# Patient Record
Sex: Female | Born: 1950
Health system: Southern US, Community
[De-identification: ages and names within clinical notes are randomized; demographics above are authoritative.]

## PROBLEM LIST (undated history)

## (undated) DIAGNOSIS — F32A Depression, unspecified: Secondary | ICD-10-CM

## (undated) DIAGNOSIS — E039 Hypothyroidism, unspecified: Secondary | ICD-10-CM

## (undated) DIAGNOSIS — J302 Other seasonal allergic rhinitis: Secondary | ICD-10-CM

## (undated) DIAGNOSIS — J449 Chronic obstructive pulmonary disease, unspecified: Secondary | ICD-10-CM

## (undated) DIAGNOSIS — I10 Essential (primary) hypertension: Secondary | ICD-10-CM

## (undated) DIAGNOSIS — I4891 Unspecified atrial fibrillation: Secondary | ICD-10-CM

## (undated) DIAGNOSIS — I509 Heart failure, unspecified: Secondary | ICD-10-CM

## (undated) DIAGNOSIS — M1990 Unspecified osteoarthritis, unspecified site: Secondary | ICD-10-CM

## (undated) DIAGNOSIS — J4489 Other specified chronic obstructive pulmonary disease: Secondary | ICD-10-CM

## (undated) DIAGNOSIS — H269 Unspecified cataract: Secondary | ICD-10-CM

## (undated) DIAGNOSIS — K219 Gastro-esophageal reflux disease without esophagitis: Secondary | ICD-10-CM

## (undated) DIAGNOSIS — I498 Other specified cardiac arrhythmias: Secondary | ICD-10-CM

## (undated) DIAGNOSIS — C801 Malignant (primary) neoplasm, unspecified: Secondary | ICD-10-CM

## (undated) HISTORY — DX: Other specified cardiac arrhythmias: I49.8

## (undated) HISTORY — DX: Heart failure, unspecified: I50.9

## (undated) HISTORY — DX: Essential (primary) hypertension: I10

## (undated) HISTORY — DX: Unspecified cataract: H26.9

## (undated) HISTORY — DX: Chronic obstructive pulmonary disease, unspecified: J44.9

## (undated) HISTORY — DX: Unspecified atrial fibrillation: I48.91

## (undated) HISTORY — PX: THYROIDECTOMY: SHX17

## (undated) HISTORY — PX: WISDOM TOOTH EXTRACTION: SHX21

## (undated) HISTORY — DX: Other seasonal allergic rhinitis: J30.2

## (undated) HISTORY — DX: Hypothyroidism, unspecified: E03.9

## (undated) HISTORY — DX: Unspecified osteoarthritis, unspecified site: M19.90

## (undated) HISTORY — DX: Other specified chronic obstructive pulmonary disease: J44.89

## (undated) HISTORY — PX: VAGINAL HYSTERECTOMY: SUR661

## (undated) HISTORY — DX: Gastro-esophageal reflux disease without esophagitis: K21.9

## (undated) HISTORY — DX: Depression, unspecified: F32.A

---

## 1997-07-16 ENCOUNTER — Other Ambulatory Visit: Admission: RE | Admit: 1997-07-16 | Discharge: 1997-07-16 | Payer: Self-pay | Admitting: Obstetrics and Gynecology

## 1998-09-20 ENCOUNTER — Other Ambulatory Visit: Admission: RE | Admit: 1998-09-20 | Discharge: 1998-09-20 | Payer: Self-pay | Admitting: Obstetrics and Gynecology

## 2000-11-26 ENCOUNTER — Other Ambulatory Visit: Admission: RE | Admit: 2000-11-26 | Discharge: 2000-11-26 | Payer: Self-pay | Admitting: Endocrinology

## 2002-09-04 ENCOUNTER — Emergency Department (HOSPITAL_COMMUNITY): Admission: EM | Admit: 2002-09-04 | Discharge: 2002-09-04 | Payer: Self-pay | Admitting: Emergency Medicine

## 2003-03-10 ENCOUNTER — Encounter (INDEPENDENT_AMBULATORY_CARE_PROVIDER_SITE_OTHER): Payer: Self-pay | Admitting: Family Medicine

## 2003-03-10 LAB — CONVERTED CEMR LAB

## 2003-07-15 ENCOUNTER — Emergency Department (HOSPITAL_COMMUNITY): Admission: EM | Admit: 2003-07-15 | Discharge: 2003-07-15 | Payer: Self-pay | Admitting: Emergency Medicine

## 2004-09-07 ENCOUNTER — Emergency Department (HOSPITAL_COMMUNITY): Admission: EM | Admit: 2004-09-07 | Discharge: 2004-09-08 | Payer: Self-pay | Admitting: Emergency Medicine

## 2004-10-05 ENCOUNTER — Emergency Department (HOSPITAL_COMMUNITY): Admission: EM | Admit: 2004-10-05 | Discharge: 2004-10-05 | Payer: Self-pay | Admitting: Emergency Medicine

## 2004-10-14 ENCOUNTER — Emergency Department (HOSPITAL_COMMUNITY): Admission: EM | Admit: 2004-10-14 | Discharge: 2004-10-14 | Payer: Self-pay | Admitting: Emergency Medicine

## 2004-10-14 ENCOUNTER — Emergency Department (HOSPITAL_COMMUNITY): Admission: EM | Admit: 2004-10-14 | Discharge: 2004-10-14 | Payer: Self-pay | Admitting: Family Medicine

## 2004-12-27 ENCOUNTER — Emergency Department (HOSPITAL_COMMUNITY): Admission: EM | Admit: 2004-12-27 | Discharge: 2004-12-27 | Payer: Self-pay | Admitting: Family Medicine

## 2004-12-30 ENCOUNTER — Emergency Department (HOSPITAL_COMMUNITY): Admission: EM | Admit: 2004-12-30 | Discharge: 2004-12-30 | Payer: Self-pay | Admitting: Emergency Medicine

## 2004-12-31 ENCOUNTER — Ambulatory Visit: Payer: Self-pay | Admitting: Family Medicine

## 2005-01-06 ENCOUNTER — Ambulatory Visit (HOSPITAL_COMMUNITY): Admission: RE | Admit: 2005-01-06 | Discharge: 2005-01-06 | Payer: Self-pay | Admitting: Internal Medicine

## 2005-01-15 ENCOUNTER — Ambulatory Visit (HOSPITAL_COMMUNITY): Admission: RE | Admit: 2005-01-15 | Discharge: 2005-01-15 | Payer: Self-pay | Admitting: Internal Medicine

## 2005-01-15 ENCOUNTER — Encounter (INDEPENDENT_AMBULATORY_CARE_PROVIDER_SITE_OTHER): Payer: Self-pay | Admitting: *Deleted

## 2005-01-21 ENCOUNTER — Ambulatory Visit: Payer: Self-pay | Admitting: *Deleted

## 2005-01-30 ENCOUNTER — Ambulatory Visit: Payer: Self-pay | Admitting: Internal Medicine

## 2005-01-30 ENCOUNTER — Inpatient Hospital Stay (HOSPITAL_COMMUNITY): Admission: EM | Admit: 2005-01-30 | Discharge: 2005-02-07 | Payer: Self-pay | Admitting: Family Medicine

## 2005-02-02 ENCOUNTER — Encounter (INDEPENDENT_AMBULATORY_CARE_PROVIDER_SITE_OTHER): Payer: Self-pay | Admitting: *Deleted

## 2005-02-03 ENCOUNTER — Encounter: Payer: Self-pay | Admitting: Cardiology

## 2005-02-05 ENCOUNTER — Ambulatory Visit: Payer: Self-pay | Admitting: Cardiology

## 2005-02-06 ENCOUNTER — Ambulatory Visit: Payer: Self-pay | Admitting: Cardiovascular Disease

## 2005-02-10 ENCOUNTER — Ambulatory Visit: Payer: Self-pay

## 2005-02-13 ENCOUNTER — Ambulatory Visit: Payer: Self-pay | Admitting: Family Medicine

## 2005-02-20 ENCOUNTER — Ambulatory Visit: Payer: Self-pay | Admitting: Family Medicine

## 2005-02-25 ENCOUNTER — Ambulatory Visit: Payer: Self-pay | Admitting: Family Medicine

## 2005-02-26 ENCOUNTER — Ambulatory Visit: Payer: Self-pay | Admitting: Cardiology

## 2005-03-12 ENCOUNTER — Ambulatory Visit: Payer: Self-pay | Admitting: Family Medicine

## 2005-04-02 ENCOUNTER — Ambulatory Visit: Payer: Self-pay | Admitting: Family Medicine

## 2005-04-15 ENCOUNTER — Ambulatory Visit: Payer: Self-pay | Admitting: Cardiology

## 2005-04-21 ENCOUNTER — Ambulatory Visit: Payer: Self-pay

## 2005-04-21 ENCOUNTER — Encounter: Payer: Self-pay | Admitting: Cardiology

## 2005-04-23 ENCOUNTER — Ambulatory Visit: Payer: Self-pay | Admitting: Family Medicine

## 2005-05-25 ENCOUNTER — Ambulatory Visit: Payer: Self-pay | Admitting: Family Medicine

## 2005-05-27 ENCOUNTER — Ambulatory Visit: Payer: Self-pay | Admitting: Family Medicine

## 2005-05-28 ENCOUNTER — Ambulatory Visit (HOSPITAL_COMMUNITY): Admission: RE | Admit: 2005-05-28 | Discharge: 2005-05-28 | Payer: Self-pay | Admitting: Internal Medicine

## 2005-06-01 ENCOUNTER — Ambulatory Visit: Payer: Self-pay | Admitting: Family Medicine

## 2005-06-18 ENCOUNTER — Ambulatory Visit: Payer: Self-pay | Admitting: Family Medicine

## 2005-06-19 ENCOUNTER — Ambulatory Visit: Payer: Self-pay | Admitting: Cardiology

## 2005-06-22 ENCOUNTER — Ambulatory Visit: Payer: Self-pay | Admitting: Endocrinology

## 2005-07-02 ENCOUNTER — Ambulatory Visit: Payer: Self-pay | Admitting: Family Medicine

## 2005-07-13 ENCOUNTER — Ambulatory Visit: Payer: Self-pay | Admitting: Family Medicine

## 2005-07-13 ENCOUNTER — Inpatient Hospital Stay (HOSPITAL_COMMUNITY): Admission: EM | Admit: 2005-07-13 | Discharge: 2005-07-21 | Payer: Self-pay | Admitting: Emergency Medicine

## 2005-07-13 ENCOUNTER — Ambulatory Visit: Payer: Self-pay | Admitting: Cardiology

## 2005-07-14 ENCOUNTER — Encounter: Payer: Self-pay | Admitting: Cardiology

## 2005-08-04 ENCOUNTER — Ambulatory Visit: Payer: Self-pay | Admitting: Cardiology

## 2005-08-07 ENCOUNTER — Ambulatory Visit: Payer: Self-pay | Admitting: Family Medicine

## 2005-08-24 ENCOUNTER — Ambulatory Visit (HOSPITAL_COMMUNITY): Admission: RE | Admit: 2005-08-24 | Discharge: 2005-08-24 | Payer: Self-pay | Admitting: General Surgery

## 2005-08-28 ENCOUNTER — Ambulatory Visit: Payer: Self-pay | Admitting: Family Medicine

## 2005-09-07 ENCOUNTER — Ambulatory Visit: Payer: Self-pay | Admitting: Family Medicine

## 2005-09-23 ENCOUNTER — Ambulatory Visit: Payer: Self-pay | Admitting: Family Medicine

## 2005-09-30 ENCOUNTER — Ambulatory Visit: Payer: Self-pay | Admitting: Cardiology

## 2005-10-06 ENCOUNTER — Encounter (INDEPENDENT_AMBULATORY_CARE_PROVIDER_SITE_OTHER): Payer: Self-pay | Admitting: *Deleted

## 2005-10-06 ENCOUNTER — Ambulatory Visit (HOSPITAL_COMMUNITY): Admission: RE | Admit: 2005-10-06 | Discharge: 2005-10-07 | Payer: Self-pay | Admitting: General Surgery

## 2005-11-06 ENCOUNTER — Ambulatory Visit: Payer: Self-pay | Admitting: Family Medicine

## 2005-12-16 ENCOUNTER — Ambulatory Visit: Payer: Self-pay | Admitting: Family Medicine

## 2005-12-24 ENCOUNTER — Ambulatory Visit: Payer: Self-pay | Admitting: Cardiology

## 2006-01-01 ENCOUNTER — Ambulatory Visit: Payer: Self-pay | Admitting: Cardiology

## 2006-01-04 ENCOUNTER — Ambulatory Visit: Payer: Self-pay | Admitting: Family Medicine

## 2006-01-22 ENCOUNTER — Emergency Department (HOSPITAL_COMMUNITY): Admission: EM | Admit: 2006-01-22 | Discharge: 2006-01-22 | Payer: Self-pay | Admitting: Emergency Medicine

## 2006-02-04 ENCOUNTER — Encounter: Payer: Self-pay | Admitting: Cardiology

## 2006-02-04 ENCOUNTER — Ambulatory Visit: Payer: Self-pay | Admitting: Cardiology

## 2006-02-04 ENCOUNTER — Ambulatory Visit: Payer: Self-pay

## 2006-03-17 ENCOUNTER — Ambulatory Visit: Payer: Self-pay | Admitting: Cardiology

## 2006-03-23 ENCOUNTER — Ambulatory Visit: Payer: Self-pay | Admitting: Family Medicine

## 2006-05-05 ENCOUNTER — Ambulatory Visit: Payer: Self-pay | Admitting: Family Medicine

## 2006-06-09 ENCOUNTER — Ambulatory Visit (HOSPITAL_COMMUNITY): Admission: RE | Admit: 2006-06-09 | Discharge: 2006-06-09 | Payer: Self-pay | Admitting: Family Medicine

## 2006-06-23 ENCOUNTER — Ambulatory Visit: Payer: Self-pay | Admitting: Family Medicine

## 2006-07-22 ENCOUNTER — Ambulatory Visit: Payer: Self-pay | Admitting: Family Medicine

## 2006-08-16 ENCOUNTER — Encounter (INDEPENDENT_AMBULATORY_CARE_PROVIDER_SITE_OTHER): Payer: Self-pay | Admitting: Family Medicine

## 2006-08-16 DIAGNOSIS — I10 Essential (primary) hypertension: Secondary | ICD-10-CM | POA: Insufficient documentation

## 2006-08-16 DIAGNOSIS — K219 Gastro-esophageal reflux disease without esophagitis: Secondary | ICD-10-CM | POA: Insufficient documentation

## 2006-08-16 DIAGNOSIS — I4891 Unspecified atrial fibrillation: Secondary | ICD-10-CM | POA: Insufficient documentation

## 2006-08-16 DIAGNOSIS — Z9089 Acquired absence of other organs: Secondary | ICD-10-CM | POA: Insufficient documentation

## 2006-08-16 DIAGNOSIS — J449 Chronic obstructive pulmonary disease, unspecified: Secondary | ICD-10-CM | POA: Insufficient documentation

## 2006-08-16 DIAGNOSIS — J4489 Other specified chronic obstructive pulmonary disease: Secondary | ICD-10-CM | POA: Insufficient documentation

## 2006-08-16 DIAGNOSIS — M1990 Unspecified osteoarthritis, unspecified site: Secondary | ICD-10-CM | POA: Insufficient documentation

## 2006-08-16 DIAGNOSIS — Z9079 Acquired absence of other genital organ(s): Secondary | ICD-10-CM | POA: Insufficient documentation

## 2006-08-16 DIAGNOSIS — I509 Heart failure, unspecified: Secondary | ICD-10-CM | POA: Insufficient documentation

## 2006-08-16 DIAGNOSIS — E039 Hypothyroidism, unspecified: Secondary | ICD-10-CM | POA: Insufficient documentation

## 2006-09-02 ENCOUNTER — Ambulatory Visit: Payer: Self-pay | Admitting: Internal Medicine

## 2006-09-13 ENCOUNTER — Ambulatory Visit: Payer: Self-pay | Admitting: Family Medicine

## 2006-09-15 ENCOUNTER — Ambulatory Visit: Payer: Self-pay | Admitting: Cardiology

## 2006-09-20 ENCOUNTER — Ambulatory Visit (HOSPITAL_COMMUNITY): Admission: RE | Admit: 2006-09-20 | Discharge: 2006-09-20 | Payer: Self-pay | Admitting: Internal Medicine

## 2006-09-20 ENCOUNTER — Ambulatory Visit: Payer: Self-pay | Admitting: Cardiology

## 2006-09-20 LAB — CONVERTED CEMR LAB
BUN: 23 mg/dL (ref 6–23)
CO2: 27 meq/L (ref 19–32)
Calcium: 9.5 mg/dL (ref 8.4–10.5)
Chloride: 101 meq/L (ref 96–112)
Creatinine, Ser: 1 mg/dL (ref 0.4–1.2)
GFR calc Af Amer: 73 mL/min
GFR calc non Af Amer: 61 mL/min
Glucose, Bld: 72 mg/dL (ref 70–99)
Potassium: 3.8 meq/L (ref 3.5–5.1)
Sodium: 139 meq/L (ref 135–145)

## 2006-10-14 ENCOUNTER — Ambulatory Visit: Payer: Self-pay | Admitting: Internal Medicine

## 2006-12-11 ENCOUNTER — Encounter: Payer: Self-pay | Admitting: *Deleted

## 2006-12-11 DIAGNOSIS — E0590 Thyrotoxicosis, unspecified without thyrotoxic crisis or storm: Secondary | ICD-10-CM | POA: Insufficient documentation

## 2007-04-28 ENCOUNTER — Encounter (INDEPENDENT_AMBULATORY_CARE_PROVIDER_SITE_OTHER): Payer: Self-pay | Admitting: Family Medicine

## 2007-04-28 ENCOUNTER — Ambulatory Visit: Payer: Self-pay | Admitting: Internal Medicine

## 2007-04-28 LAB — CONVERTED CEMR LAB
ALT: 16 units/L (ref 0–35)
BUN: 18 mg/dL (ref 6–23)
CO2: 23 meq/L (ref 19–32)
Calcium: 9.6 mg/dL (ref 8.4–10.5)
Chloride: 107 meq/L (ref 96–112)
Creatinine, Ser: 1.1 mg/dL (ref 0.40–1.20)
TSH: 25.279 microintl units/mL — ABNORMAL HIGH (ref 0.350–5.50)
Total Bilirubin: 0.4 mg/dL (ref 0.3–1.2)

## 2007-05-30 ENCOUNTER — Ambulatory Visit: Payer: Self-pay | Admitting: Internal Medicine

## 2007-06-11 ENCOUNTER — Emergency Department (HOSPITAL_COMMUNITY): Admission: EM | Admit: 2007-06-11 | Discharge: 2007-06-11 | Payer: Self-pay | Admitting: Emergency Medicine

## 2007-06-29 ENCOUNTER — Ambulatory Visit: Payer: Self-pay | Admitting: Internal Medicine

## 2007-07-05 IMAGING — CR DG CHEST 2V
2 series · 2 of 2 positions shown · non-contrast
Comparison: 10/14/04.

CLINICAL DATA: 54-year-old with chest pain and shortness of breath.  Heart murmur.  
 CHEST ? TWO VIEW:

[view not recorded (1 of 2)]
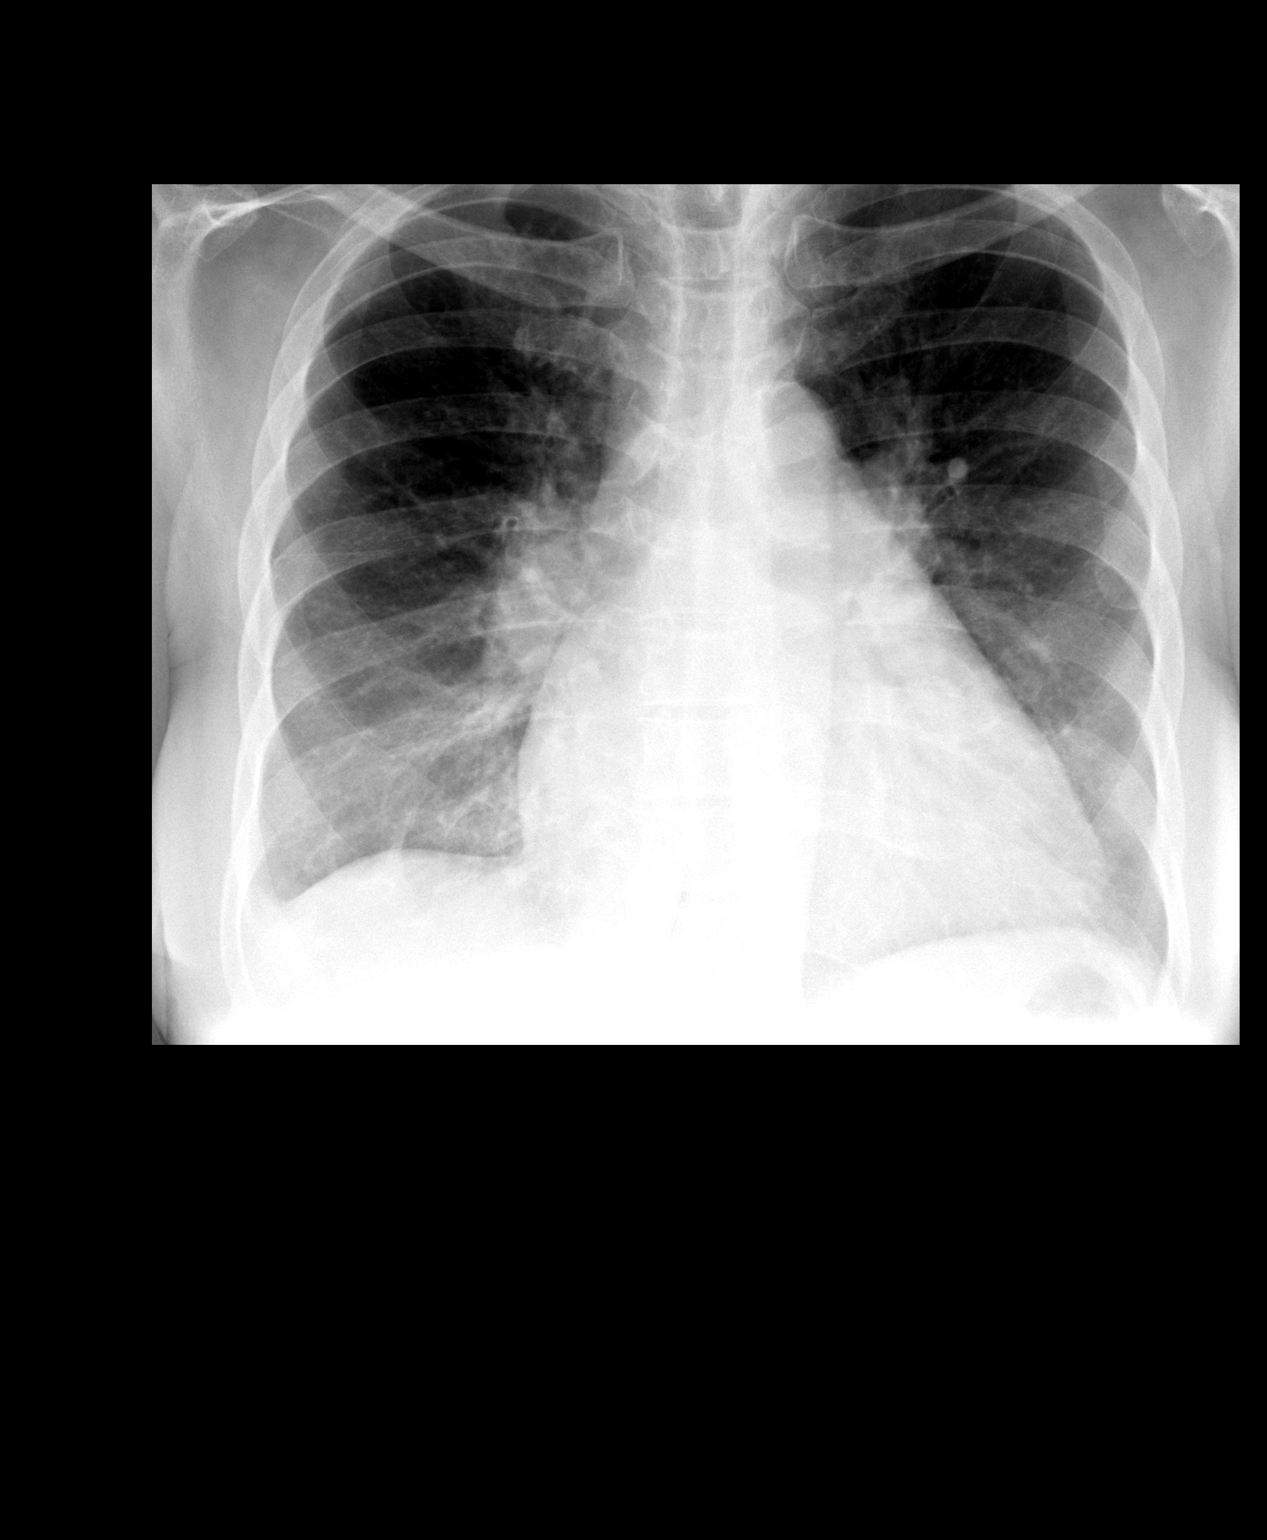

[view not recorded (2 of 2)]
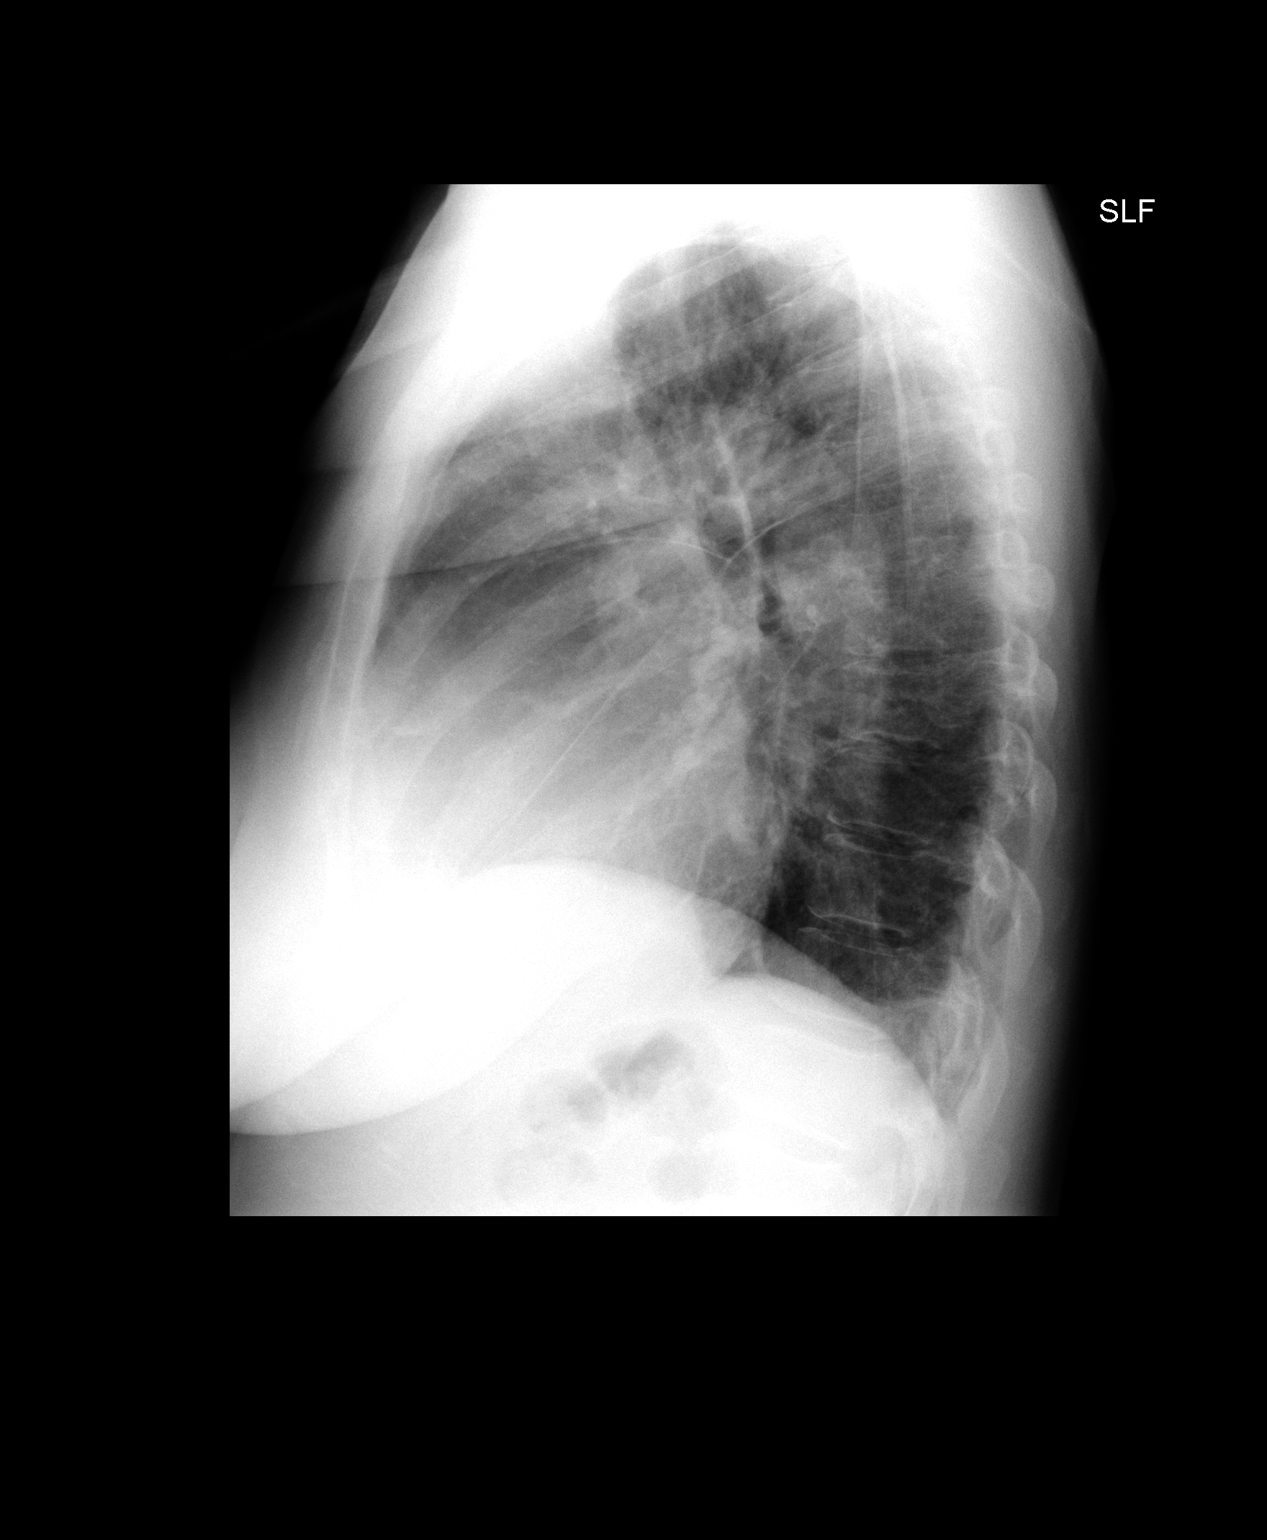

[2 of 2 positions shown; findings below may reference images not displayed]

Heart is enlarged.  There is pulmonary vascular congestion and perihilar pulmonary edema.  No focal consolidation.  There is a small right pleural effusion.
IMPRESSION: Cardiomegaly and mild perihilar edema.

## 2007-07-13 ENCOUNTER — Ambulatory Visit (HOSPITAL_COMMUNITY): Admission: RE | Admit: 2007-07-13 | Discharge: 2007-07-13 | Payer: Self-pay | Admitting: Family Medicine

## 2007-07-15 IMAGING — US US SOFT TISSUE HEAD/NECK
1 series · 14 of 25 positions shown · non-contrast
Comparison: none

CLINICAL DATA: 54-year-old ? clinical Graves disease. 
THYROID ULTRASOUND:
TECHNIQUE: Ultrasound examination of the thyroid gland and adjacent soft tissue structures was performed.

[Series 1: unknown · 0.12mm/px · 14 of 43 slices shown]
[im 1/43]
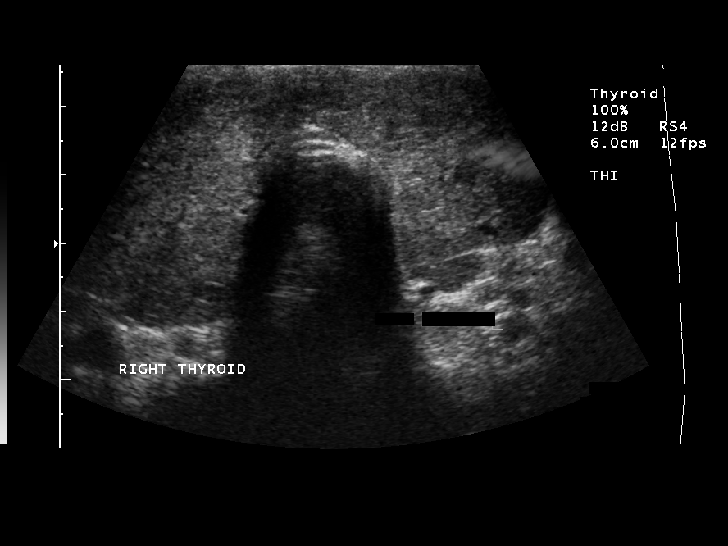
[im 4/43]
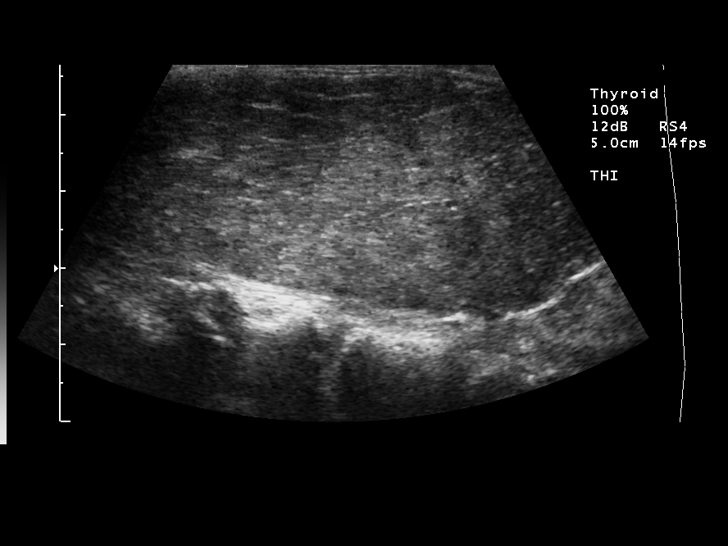
[im 8/43]
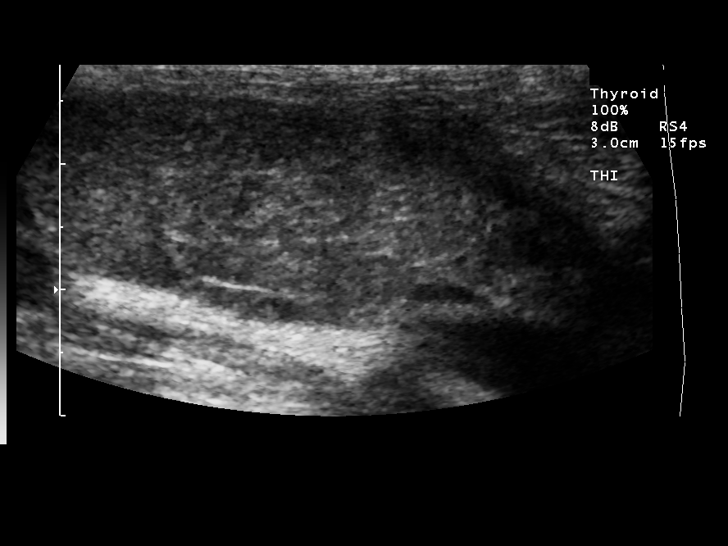
[im 11/43]
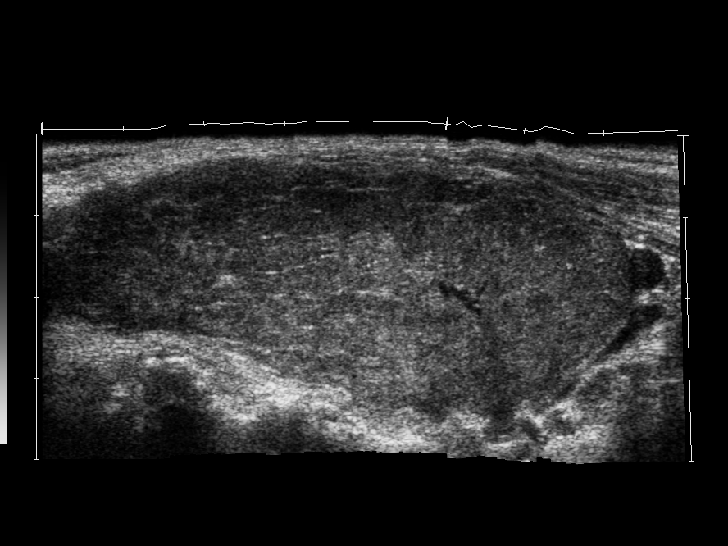
[im 15/43]
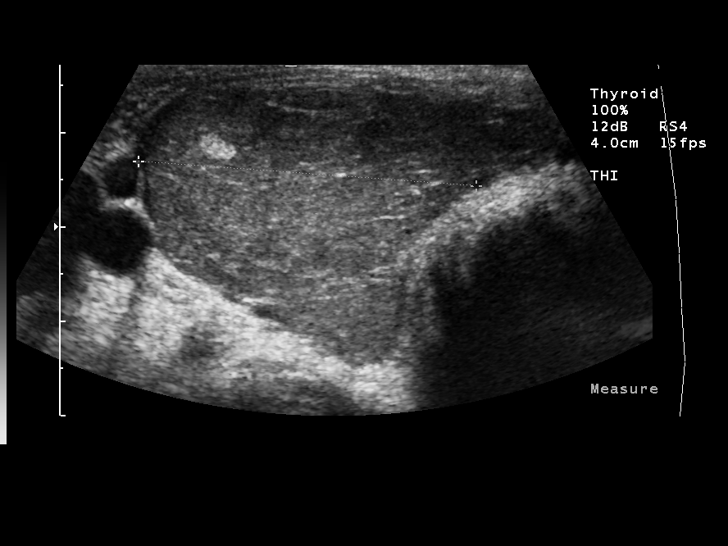
[im 16/43]
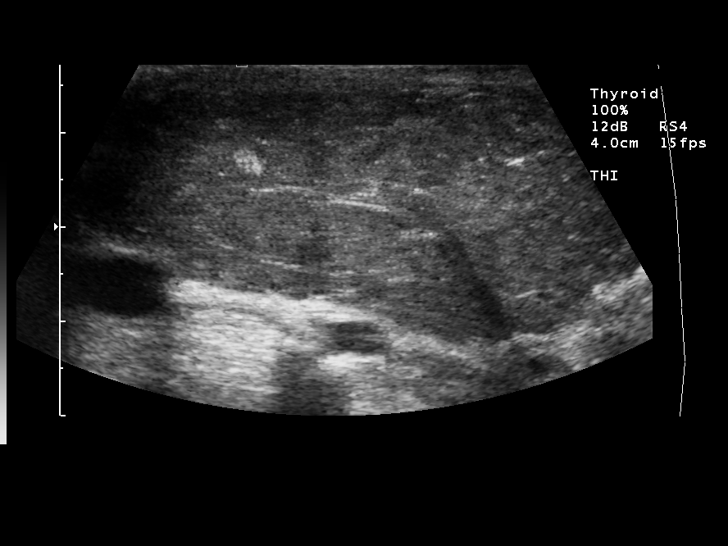
[im 20/43]
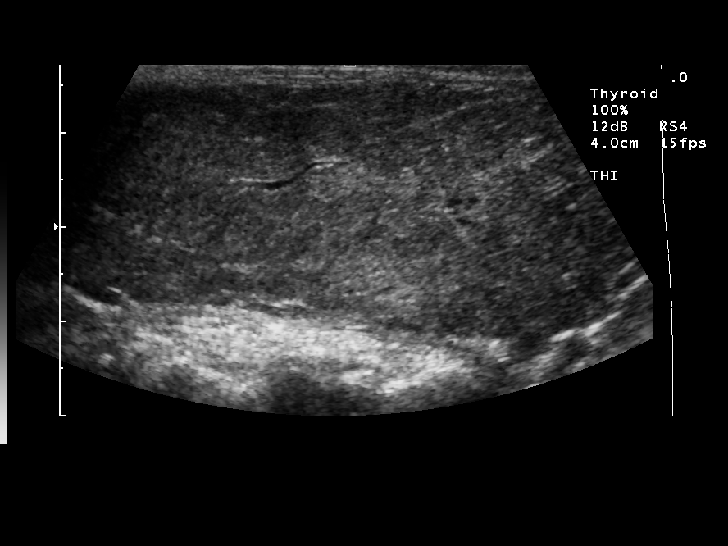
[im 23/43]
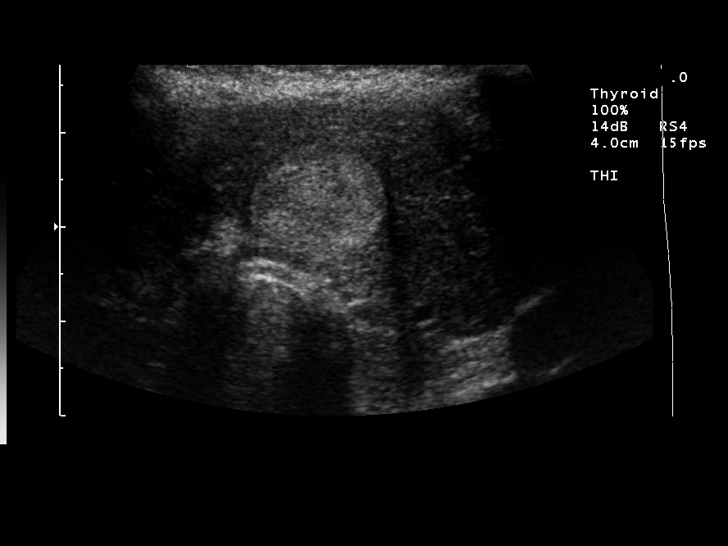
[im 27/43]
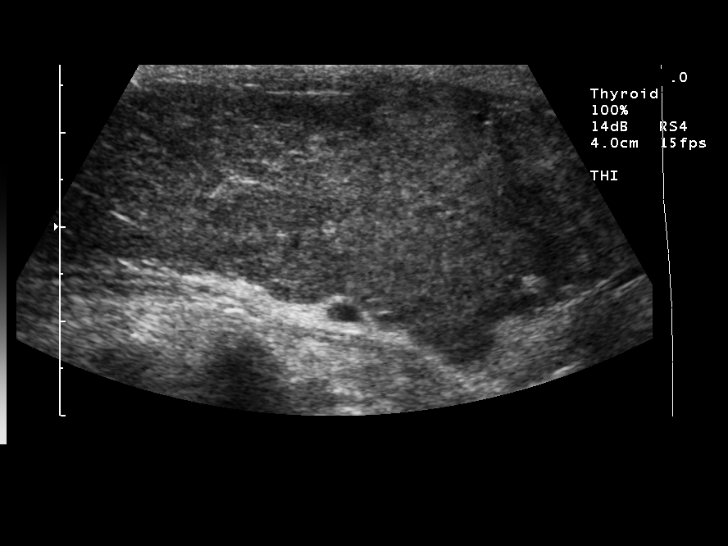
[im 29/43]
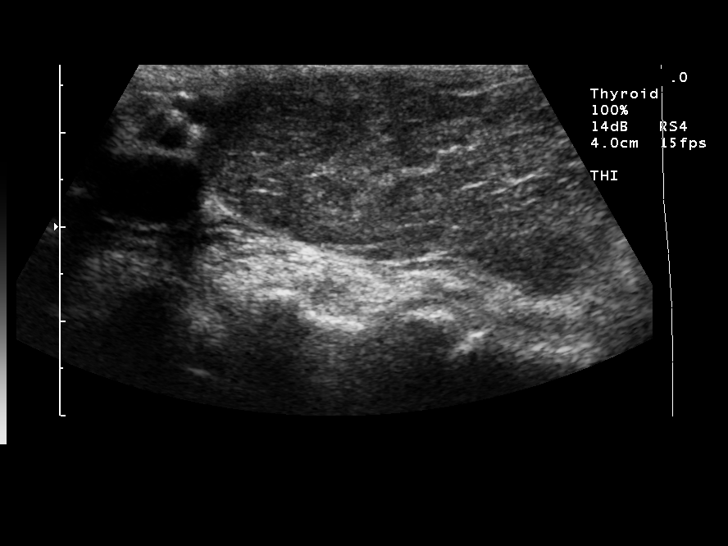
[im 32/43]
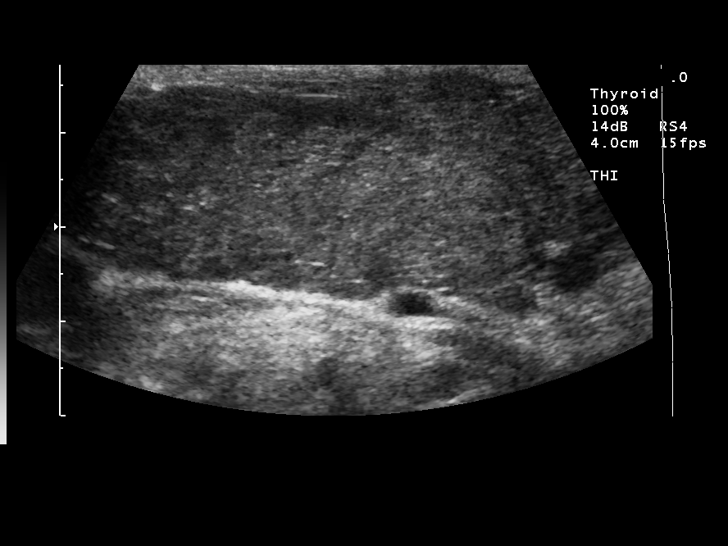
[im 36/43]
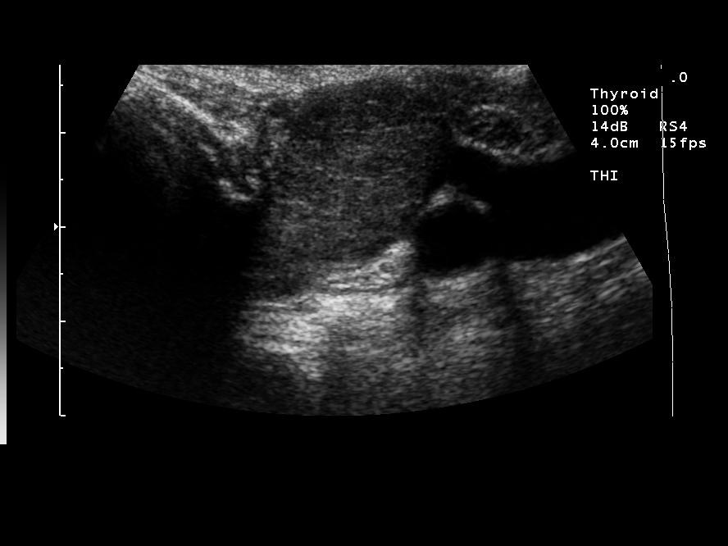
[im 39/43]
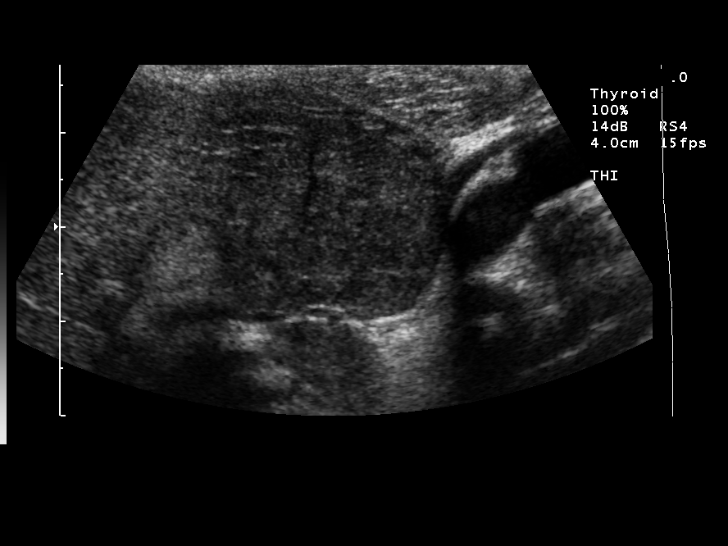
[im 43/43]
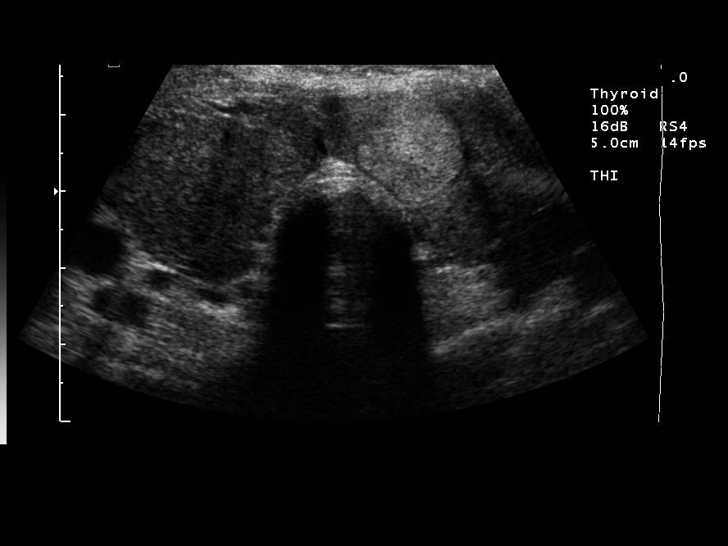

[14 of 25 positions shown; findings below may reference images not displayed]

FINDINGS: The right thyroid lobe measures 7.3 X 3.1 X 3.6 cm.  The left lobe measures 7.1 X 2.8 X 2.8 cm.  The isthmus measures 1.1 cm.  The echogenicity of the thyroid gland is diffusely heterogeneous.  There is a rounded slightly hyperechoic solid nodule in the isthmus.  This measures 1.4 X 1.5 X 1.4 cm.  Given its size and appearance I would recommend a fine needle aspiration biopsy.  There is also a tiny echogenic lesion in the right thyroid lobe which measures approximately 3 X 3 mm in size.  This can be followed with ultrasound on a yearly basis.
IMPRESSION: 1.  Thyroid goiter with diffuse heterogeneity. 
2.  1.4 X 1.5 X 1.4 cm solid nodule in the left aspect of the isthmus.  Recommend fine needle aspiration biopsy.  
3.  3 mm lesion in the right thyroid lobe can be followed with ultrasound on a yearly basis.

## 2007-07-28 ENCOUNTER — Ambulatory Visit: Payer: Self-pay | Admitting: Internal Medicine

## 2007-07-28 ENCOUNTER — Encounter: Payer: Self-pay | Admitting: Family Medicine

## 2007-07-28 LAB — CONVERTED CEMR LAB
ALT: 16 units/L (ref 0–35)
AST: 19 units/L (ref 0–37)
Albumin: 4 g/dL (ref 3.5–5.2)
CO2: 23 meq/L (ref 19–32)
Calcium: 9.6 mg/dL (ref 8.4–10.5)
Chloride: 108 meq/L (ref 96–112)
Cholesterol: 176 mg/dL (ref 0–200)
Creatinine, Ser: 0.92 mg/dL (ref 0.40–1.20)
Potassium: 4.3 meq/L (ref 3.5–5.3)
Sodium: 142 meq/L (ref 135–145)
T3, Total: 133.6 ng/dL (ref 80.0–204.0)
T4, Total: 14.3 ug/dL — ABNORMAL HIGH (ref 5.0–12.5)
TSH: 0.008 microintl units/mL — ABNORMAL LOW (ref 0.350–5.50)
Total CHOL/HDL Ratio: 2.8
Total Protein: 7.9 g/dL (ref 6.0–8.3)

## 2007-08-03 ENCOUNTER — Ambulatory Visit: Payer: Self-pay | Admitting: Internal Medicine

## 2007-09-15 ENCOUNTER — Ambulatory Visit: Payer: Self-pay | Admitting: Internal Medicine

## 2007-09-15 ENCOUNTER — Encounter: Payer: Self-pay | Admitting: Family Medicine

## 2007-09-15 LAB — CONVERTED CEMR LAB
Free T4: 1.86 ng/dL — ABNORMAL HIGH (ref 0.89–1.80)
T3, Total: 112.5 ng/dL (ref 80.0–204.0)
TSH: 0.074 microintl units/mL — ABNORMAL LOW (ref 0.350–4.50)

## 2007-09-21 ENCOUNTER — Ambulatory Visit: Payer: Self-pay | Admitting: Internal Medicine

## 2007-09-28 ENCOUNTER — Ambulatory Visit: Payer: Self-pay | Admitting: Cardiology

## 2007-09-28 LAB — CONVERTED CEMR LAB
CO2: 27 meq/L (ref 19–32)
GFR calc Af Amer: 83 mL/min
Glucose, Bld: 84 mg/dL (ref 70–99)
Potassium: 4.1 meq/L (ref 3.5–5.1)
Sodium: 141 meq/L (ref 135–145)

## 2007-09-29 ENCOUNTER — Emergency Department (HOSPITAL_COMMUNITY): Admission: EM | Admit: 2007-09-29 | Discharge: 2007-09-29 | Payer: Self-pay | Admitting: Emergency Medicine

## 2008-07-18 ENCOUNTER — Ambulatory Visit (HOSPITAL_COMMUNITY): Admission: RE | Admit: 2008-07-18 | Discharge: 2008-07-18 | Payer: Self-pay | Admitting: Internal Medicine

## 2008-07-23 ENCOUNTER — Encounter (INDEPENDENT_AMBULATORY_CARE_PROVIDER_SITE_OTHER): Payer: Self-pay | Admitting: *Deleted

## 2008-10-04 ENCOUNTER — Ambulatory Visit: Payer: Self-pay | Admitting: Cardiology

## 2008-10-04 ENCOUNTER — Encounter (INDEPENDENT_AMBULATORY_CARE_PROVIDER_SITE_OTHER): Payer: Self-pay | Admitting: *Deleted

## 2008-10-04 DIAGNOSIS — I498 Other specified cardiac arrhythmias: Secondary | ICD-10-CM | POA: Insufficient documentation

## 2008-11-18 ENCOUNTER — Emergency Department (HOSPITAL_COMMUNITY): Admission: EM | Admit: 2008-11-18 | Discharge: 2008-11-18 | Payer: Self-pay | Admitting: Emergency Medicine

## 2009-08-12 ENCOUNTER — Ambulatory Visit (HOSPITAL_COMMUNITY): Admission: RE | Admit: 2009-08-12 | Discharge: 2009-08-12 | Payer: Self-pay | Admitting: Internal Medicine

## 2009-12-10 ENCOUNTER — Ambulatory Visit: Payer: Self-pay | Admitting: Cardiology

## 2009-12-10 DIAGNOSIS — R079 Chest pain, unspecified: Secondary | ICD-10-CM | POA: Insufficient documentation

## 2009-12-11 ENCOUNTER — Encounter (INDEPENDENT_AMBULATORY_CARE_PROVIDER_SITE_OTHER): Payer: Self-pay | Admitting: *Deleted

## 2009-12-11 LAB — CONVERTED CEMR LAB
BUN: 15 mg/dL (ref 6–23)
Chloride: 105 meq/L (ref 96–112)
Glucose, Bld: 78 mg/dL (ref 70–99)
Potassium: 4.2 meq/L (ref 3.5–5.1)

## 2009-12-18 ENCOUNTER — Telehealth (INDEPENDENT_AMBULATORY_CARE_PROVIDER_SITE_OTHER): Payer: Self-pay | Admitting: Radiology

## 2009-12-19 ENCOUNTER — Encounter (HOSPITAL_COMMUNITY)
Admission: RE | Admit: 2009-12-19 | Discharge: 2010-01-03 | Payer: Self-pay | Source: Home / Self Care | Attending: Cardiology | Admitting: Cardiology

## 2009-12-19 ENCOUNTER — Ambulatory Visit: Payer: Self-pay

## 2009-12-19 ENCOUNTER — Ambulatory Visit: Payer: Self-pay | Admitting: Cardiology

## 2009-12-19 ENCOUNTER — Encounter: Payer: Self-pay | Admitting: Cardiology

## 2010-01-10 ENCOUNTER — Encounter: Payer: Self-pay | Admitting: Cardiology

## 2010-01-13 ENCOUNTER — Ambulatory Visit: Payer: Self-pay | Admitting: Cardiology

## 2010-04-10 NOTE — Assessment & Plan Note (Signed)
Summary: PER CHECK OUT/SF   Primary [name] [name]:  dr polite  CC:  check up...pt complains of sob.  History of Present Illness: [name] [name] is a very pleasant  female who has a history of congestive heart failure, felt secondary to diastolic dysfunction and paroxysmal atrial fibrillation in the setting of hyperthyroidism and mild bradycardia.   Note, she had her last echocardiogram performed on February 04, 2006, that showed normal LV function and trivial mitral regurgitation and tricuspid regurgitation.  I saw her in October of 2011 with atypical chest pain. She had a Myoview performed in Oct 2011 that showed soft tissue attenuation but no ischemia and an ejection fraction of 77%. Since then the patient has dyspnea with more extreme activities but not with routine activities. It is relieved with rest. It is not associated with chest pain. There is no orthopnea, PND or pedal edema. There is no syncope or palpitations. There is no exertional chest pain. She has been on a diet and lost 10 pounds. She feels better because of this. She has had no recurrent chest pain.   Current Medications (verified): 1)  Omeprazole 20 Mg Cpdr (Omeprazole) .... Two Times A Day 2)  Singulair 10 Mg Tabs (Montelukast Sodium) .Marland Kitchen.. 1 By Mouth Once Daily 3)  Furosemide 20 Mg Tabs (Furosemide) .Marland Kitchen.. 1 By Mouth Every Am 4)  Norvasc 5 Mg Tabs (Amlodipine Besylate) .Marland Kitchen.. 1 By Mouth Once Daily 5)  Levothroid 88 Mcg Tabs (Levothyroxine Sodium) .Marland Kitchen.. 1  Tab By Mouth Once Daily 6)  Colace 100 Mg Caps (Docusate Sodium) .... By Mouth At Bedtime 7)  Caltrate 600+d  Tabs (Calcium Carbonate-Vitamin D Tabs) .Marland Kitchen.. 1 By Mouth Two Times A Day 8)  Diovan 160 Mg Tabs (Valsartan) .Marland Kitchen.. 1 Tab Once Daily 9)  Enablex 7.5 Mg Xr24h-Tab (Darifenacin Hydrobromide) .Marland Kitchen.. 1 Ta Once Daily 10)  Alphagan P 0.15 % Soln (Brimonidine Tartrate) .Marland Kitchen.. 1 Drop Both Eyes Three Times A Day 11)  Travatan Z 0.004 % Soln (Travoprost) .... Take As Directed 12)  Aspirin 81 Mg  Tbec (Aspirin) .... Take One Tablet By Mouth Daily  Allergies: 1)  ! Penicillin  Past History:  Past Medical History: Reviewed history from 10/04/2008 and no changes required. HYPERTHYROIDISM (ICD-242.90) CONGESTIVE HEART FAILURE (ICD-428.0) Hx of THYROIDECTOMY, HX OF (ICD-V45.79) HYSTERECTOMY, HX OF (ICD-V45.77) OSTEOARTHRITIS (ICD-715.90) GERD (ICD-530.81) HYPERTENSION (ICD-401.9) ATRIAL FIBRILLATION, PAROXYSMAL (ICD-427.31) COPD (ICD-496) HYPOTHYROIDISM (ICD-244.9)  Past Surgical History: Thyroidectomy  Social History: Reviewed history from 10/04/2008 and no changes required.   The patient does not drink.  She does not use illicit  drugs.  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  She lives in [place]. Tobacco Use - No.   Review of Systems       no fevers or chills, productive cough, hemoptysis, dysphasia, odynophagia, melena, hematochezia, dysuria, hematuria, rash, seizure activity, orthopnea, PND, pedal edema, claudication. Remaining systems are negative.   Vital Signs:  Patient profile:   60 year old female Height:      63 inches Weight:      170 pounds BMI:     30.22 Pulse rate:   49 / minute Resp:     14 per minute BP sitting:   106 / 73  (left arm)  Vitals Entered By: Kem Parkinson (January 13, 2010 11:13 AM)  Physical Exam  General:  Well-developed well-nourished in no acute distress.  Skin is warm and dry.  HEENT is normal.  Neck is supple. No thyromegaly.  Chest is clear to auscultation with normal expansion.  Cardiovascular exam is regular rate and rhythm.  Abdominal exam nontender or distended. No masses palpated. Extremities show no edema. neuro grossly intact    Impression & Recommendations:  Problem # 1:  CHEST PAIN (ICD-786.50) No further symptoms. Myoview showed soft tissue attenuation but no ischemia. No further workup at this point. Her updated medication list for this problem includes:    Norvasc 5 Mg Tabs  (Amlodipine besylate) .Marland Kitchen... 1 by mouth once daily    Aspirin 81 Mg Tbec (Aspirin) .Marland Kitchen... Take one tablet by mouth daily  Problem # 2:  BRADYCARDIA (ICD-427.89) No recent symptoms. Her updated medication list for this problem includes:    Norvasc 5 Mg Tabs (Amlodipine besylate) .Marland Kitchen... 1 by mouth once daily    Aspirin 81 Mg Tbec (Aspirin) .Marland Kitchen... Take one tablet by mouth daily  Problem # 3:  HYPERTENSION (ICD-401.9) Blood pressure control. Potassium and renal function monitored by primary care. Her updated medication list for this problem includes:    Furosemide 20 Mg Tabs (Furosemide) .Marland Kitchen... 1 by mouth every am    Norvasc 5 Mg Tabs (Amlodipine besylate) .Marland Kitchen... 1 by mouth once daily    Diovan 160 Mg Tabs (Valsartan) .Marland Kitchen... 1 tab once daily    Aspirin 81 Mg Tbec (Aspirin) .Marland Kitchen... Take one tablet by mouth daily  Problem # 4:  ATRIAL FIBRILLATION, PAROXYSMAL (ICD-427.31) Occurred in the setting of hyperthyroidism. Now corrected. Her updated medication list for this problem includes:    Aspirin 81 Mg Tbec (Aspirin) .Marland Kitchen... Take one tablet by mouth daily  Problem # 5:  HYPOTHYROIDISM (ICD-244.9)  Her updated medication list for this problem includes:    Levothroid 88 Mcg Tabs (Levothyroxine sodium) .Marland Kitchen... 1  tab by mouth once daily  Problem # 6:  COPD (ICD-496)  Her updated medication list for this problem includes:    Singulair 10 Mg Tabs (Montelukast sodium) .Marland Kitchen... 1 by mouth once daily  Patient Instructions: 1)  Your physician recommends that you schedule a follow-up appointment in: ONE YEAR

## 2010-04-10 NOTE — Miscellaneous (Signed)
Summary: Orders Update  Clinical Lists Changes  Orders: Added new Referral order of Nuclear Stress Test (Nuc Stress Test) - Signed 

## 2010-04-10 NOTE — Assessment & Plan Note (Signed)
Summary: Cardiology Nuclear Testing  Nuclear Med Background Indications for Stress Test: Evaluation for Ischemia   History: COPD, Echo, Myocardial Perfusion Study  History Comments: '06 [id number]:[id number]; h/o afib.  Symptoms: Chest Pain, Chest Tightness, Chest Tightness with Exertion, DOE, Fatigue  Symptoms Comments: CP>(R)arm numbness. Last episode of CP:2 days ago.   Nuclear Pre-Procedure Cardiac Risk Factors: History of Smoking, Hypertension Caffeine/Decaff Intake: None NPO After: 11:00 PM Lungs: Clear.  O2 Sat 99% on RA. IV 0.9% NS with Angio Cath: 22g     IV Site: R Wrist IV Started by: Bonnita Levan, RN Chest Size (in) 38     Cup Size D     Height (in): 63 Weight (lb): 167 BMI: 29.69  Nuclear Med Study 1 or 2 day study:  1 day     Stress Test Type:  Eugenie Birks Reading MD:  Olga Millers, MD     Referring MD:  Olga Millers, MD Resting Radionuclide:  Technetium 42m Tetrofosmin     Resting Radionuclide Dose:  11 mCi  Stress Radionuclide:  Technetium 20m Tetrofosmin     Stress Radionuclide Dose:  33 mCi   Stress Protocol   Lexiscan: 0.4 mg   Stress Test Technologist:  Rea College, CMA-N     Nuclear Technologist:  Doyne Keel, CNMT  Rest Procedure  Myocardial perfusion imaging was performed at rest 45 minutes following the intravenous administration of Technetium 25m Tetrofosmin.  Stress Procedure  The patient received IV Lexiscan 0.4 mg over 15-seconds.  Technetium 78m Tetrofosmin injected at 30-seconds.  There were nonspecific T-wave changes and occasional PVC's with infusion.  Quantitative spect images were obtained after a 45 minute delay.  QPS Raw Data Images:  Acquisition technically good; normal left ventricular size. Stress Images:  There is decreased uptake in the anterior wall. Rest Images:  There is decreased uptake in the anterior wall (slight reversibility felt to most likely not be clinically significant). Subtraction (SDS):  No evidence of  ischemia. Transient Ischemic Dilatation:  .99  (Normal <1.22)  Lung/Heart Ratio:  .27  (Normal <0.45)  Quantitative Gated Spect Images QGS EDV:  79 ml QGS ESV:  18 ml QGS EF:  77 % QGS cine images:  Normal wall motion.   Overall Impression  Exercise Capacity: Lexiscan with no exercise. BP Response: Normal blood pressure response. Clinical Symptoms: No chest pain ECG Impression: No significant ST segment change suggestive of ischemia. Overall Impression: Probable normal lexiscan nuclear study with soft tissue attenuation but no ischemia.  Appended Document: Cardiology Nuclear Testing ok  Appended Document: Cardiology Nuclear Testing PT AWARE./CY

## 2010-04-10 NOTE — Miscellaneous (Signed)
  Clinical Lists Changes  Orders: Added new Test order of TLB-BMP (Basic Metabolic Panel-BMET) (80048-METABOL) - Signed

## 2010-04-10 NOTE — Assessment & Plan Note (Signed)
Summary: f1y/per pt call/chest pain off and on/pain in arm/lg   Primary Preethi Scantlebury:  dr polite  CC:  chest pain  and and arm numbness.  History of Present Illness: [name] [name] is a very pleasant  female who has a history of congestive heart failure, felt secondary to diastolic dysfunction and paroxysmal atrial fibrillation in the setting of hyperthyroidism and mild bradycardia.   Note, she had her last echocardiogram performed on February 04, 2006, that showed normal LV function and trivial mitral regurgitation and tricuspid regurgitation.  She has also had a previous Myoview performed on February 06, 2005, that showed no ischemia and an ejection fraction of 63%. I last saw her in July of 2010. Since then over the past several weeks she has noticed an occasional tightness in her chest. It occurs with exertion and improves with rest. It lasts 3-5 seconds and resolve spontaneously. It is not pleuritic or positional. It is not related to food. There is no associated symptoms.  Current Medications (verified): 1)  Omeprazole 20 Mg Cpdr (Omeprazole) .... Two Times A Day 2)  Singulair 10 Mg Tabs (Montelukast Sodium) .Marland Kitchen.. 1 By Mouth Once Daily 3)  Furosemide 20 Mg Tabs (Furosemide) .Marland Kitchen.. 1 By Mouth Every Am 4)  Norvasc 5 Mg Tabs (Amlodipine Besylate) .Marland Kitchen.. 1 By Mouth Once Daily 5)  Levothroid 88 Mcg Tabs (Levothyroxine Sodium) .Marland Kitchen.. 1  Tab By Mouth Once Daily 6)  Colace 100 Mg Caps (Docusate Sodium) .... By Mouth At Bedtime 7)  Caltrate 600+d  Tabs (Calcium Carbonate-Vitamin D Tabs) .Marland Kitchen.. 1 By Mouth Two Times A Day 8)  Diovan 160 Mg Tabs (Valsartan) .Marland Kitchen.. 1 Tab Once Daily 9)  Enablex 7.5 Mg Xr24h-Tab (Darifenacin Hydrobromide) .Marland Kitchen.. 1 Ta Once Daily 10)  Alphagan P 0.15 % Soln (Brimonidine Tartrate) .Marland Kitchen.. 1 Drop Both Eyes Three Times A Day 11)  Travatan Z 0.004 % Soln (Travoprost) .... Take As Directed 12)  Aspirin 81 Mg Tbec (Aspirin) .... Take One Tablet By Mouth Daily  Allergies: 1)  ! Penicillin  Past  History:  Past Medical History: Reviewed history from 10/04/2008 and no changes required. HYPERTHYROIDISM (ICD-242.90) CONGESTIVE HEART FAILURE (ICD-428.0) Hx of THYROIDECTOMY, HX OF (ICD-V45.79) HYSTERECTOMY, HX OF (ICD-V45.77) OSTEOARTHRITIS (ICD-715.90) GERD (ICD-530.81) HYPERTENSION (ICD-401.9) ATRIAL FIBRILLATION, PAROXYSMAL (ICD-427.31) COPD (ICD-496) HYPOTHYROIDISM (ICD-244.9)  Past Surgical History: Reviewed history from 10/04/2008 and no changes required.  Thyroidectomy  Social History: Reviewed history from 10/04/2008 and no changes required.   The patient does not drink.  She does not use illicit  drugs.  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 Tobacco Use - No.   Review of Systems       no fevers or chills, productive cough, hemoptysis, dysphasia, odynophagia, melena, hematochezia, dysuria, hematuria, rash, seizure activity, orthopnea, PND, pedal edema, claudication. Remaining systems are negative.   Vital Signs:  Patient profile:   60 year old female Height:      63 inches Weight:      169 pounds BMI:     30.05 Pulse rate:   40 / minute Resp:     14 per minute BP sitting:   130 / 74  (left arm)  Vitals Entered By: Kem Parkinson (December 10, 2009 2:38 PM)  Physical Exam  General:  Well-developed well-nourished in no acute distress.  Skin is warm and dry.  HEENT is normal.  Neck is supple. No thyromegaly.  Chest is clear to auscultation with normal expansion.  Cardiovascular exam is regular rate and rhythm.  Abdominal exam nontender or distended. No masses palpated. Extremities show no edema. neuro grossly intact    EKG  Procedure date:  12/10/2009  Findings:      Sinus bradycardia at a rate of 40. Nonspecific ST changes.  Impression & Recommendations:  Problem # 1:  CHEST PAIN (ICD-786.50)  Some of description is concerning but duration is very short-lived. I will schedule elective scan Myoview for  further evaluation. Her updated medication list for this problem includes:    Norvasc 5 Mg Tabs (Amlodipine besylate) .Marland Kitchen... 1 by mouth once daily    Aspirin 81 Mg Tbec (Aspirin) .Marland Kitchen... Take one tablet by mouth daily  Orders: Nuclear Stress Test (Nuc Stress Test)  Problem # 2:  BRADYCARDIA (ICD-427.89)  No symptoms. Previous treadmill showed chronotropic competence. Her updated medication list for this problem includes:    Norvasc 5 Mg Tabs (Amlodipine besylate) .Marland Kitchen... 1 by mouth once daily    Aspirin 81 Mg Tbec (Aspirin) .Marland Kitchen... Take one tablet by mouth daily  Her updated medication list for this problem includes:    Norvasc 5 Mg Tabs (Amlodipine besylate) .Marland Kitchen... 1 by mouth once daily    Aspirin 81 Mg Tbec (Aspirin) .Marland Kitchen... Take one tablet by mouth daily  Problem # 3:  CONGESTIVE HEART FAILURE (ICD-428.0) Euvolemic on examination. Continue Lasix. Renal function and potassium monitored by primary care. Her updated medication list for this problem includes:    Furosemide 20 Mg Tabs (Furosemide) .Marland Kitchen... 1 by mouth every am    Norvasc 5 Mg Tabs (Amlodipine besylate) .Marland Kitchen... 1 by mouth once daily    Diovan 160 Mg Tabs (Valsartan) .Marland Kitchen... 1 tab once daily    Aspirin 81 Mg Tbec (Aspirin) .Marland Kitchen... Take one tablet by mouth daily  Her updated medication list for this problem includes:    Furosemide 20 Mg Tabs (Furosemide) .Marland Kitchen... 1 by mouth every am    Norvasc 5 Mg Tabs (Amlodipine besylate) .Marland Kitchen... 1 by mouth once daily    Diovan 160 Mg Tabs (Valsartan) .Marland Kitchen... 1 tab once daily    Aspirin 81 Mg Tbec (Aspirin) .Marland Kitchen... Take one tablet by mouth daily  Problem # 4:  HYPERTENSION (ICD-401.9)  Blood pressure controlled on present medications. Will continue. Her updated medication list for this problem includes:    Furosemide 20 Mg Tabs (Furosemide) .Marland Kitchen... 1 by mouth every am    Norvasc 5 Mg Tabs (Amlodipine besylate) .Marland Kitchen... 1 by mouth once daily    Diovan 160 Mg Tabs (Valsartan) .Marland Kitchen... 1 tab once daily    Aspirin 81  Mg Tbec (Aspirin) .Marland Kitchen... Take one tablet by mouth daily  Her updated medication list for this problem includes:    Furosemide 20 Mg Tabs (Furosemide) .Marland Kitchen... 1 by mouth every am    Norvasc 5 Mg Tabs (Amlodipine besylate) .Marland Kitchen... 1 by mouth once daily    Diovan 160 Mg Tabs (Valsartan) .Marland Kitchen... 1 tab once daily    Aspirin 81 Mg Tbec (Aspirin) .Marland Kitchen... Take one tablet by mouth daily  Problem # 5:  HYPOTHYROIDISM (ICD-244.9)  Her updated medication list for this problem includes:    Levothroid 88 Mcg Tabs (Levothyroxine sodium) .Marland Kitchen... 1  tab by mouth once daily  Her updated medication list for this problem includes:    Levothroid 88 Mcg Tabs (Levothyroxine sodium) .Marland Kitchen... 1  tab by mouth once daily  Problem # 6:  GERD (ICD-530.81)  Her updated medication list for this problem includes:    Omeprazole  20 Mg Cpdr (Omeprazole) .Marland Kitchen..Marland Kitchen Two times a day  Her updated medication list for this problem includes:    Omeprazole 20 Mg Cpdr (Omeprazole) .Marland Kitchen..Marland Kitchen Two times a day  Problem # 7:  ATRIAL FIBRILLATION, PAROXYSMAL (ICD-427.31)  Occurred in the setting of hyper thyroidism which is now corrected. Her updated medication list for this problem includes:    Aspirin 81 Mg Tbec (Aspirin) .Marland Kitchen... Take one tablet by mouth daily  Her updated medication list for this problem includes:    Aspirin 81 Mg Tbec (Aspirin) .Marland Kitchen... Take one tablet by mouth daily  Other Orders: EKG w/ Interpretation (93000)  Patient Instructions: 1)  Your physician recommends that you schedule a follow-up appointment in: 8 WEEKS WITH DR CRENSHAW 2)  Your physician recommends that you continue on your current medications as directed. Please refer to the Current Medication list given to you today. 3)  Your physician has requested that you have an LEXISCANmyoview.  For further information please visit [email or web address]  Please follow instruction sheet, as given.

## 2010-04-10 NOTE — Progress Notes (Signed)
Summary: NUC PRE-PROCEDURE  Phone Note Outgoing Call   Call placed by: Domenic Polite, CNMT,  December 18, 2009 9:27 AM Call placed to: Patient Reason for Call: Confirm/change Appt Summary of Call: Reviewed information on Myoview Information Sheet (see scanned document for further details).  Spoke with patient. Initial call taken by: Domenic Polite, CNMT,  December 18, 2009 9:27 AM     Nuclear Med Background Indications for Stress Test: Evaluation for Ischemia   History: COPD, Echo, Myocardial Perfusion Study  History Comments: '06 NML MPI , '07 ECHO NML EF,HX CHF / A-FIB  Symptoms: Chest Pain, Chest Tightness, Chest Tightness with Exertion    Nuclear Pre-Procedure Cardiac Risk Factors: Hypertension Height (in): 63

## 2010-04-10 NOTE — Miscellaneous (Signed)
Summary: Orders Update  Clinical Lists Changes  Orders: Added new Test order of TLB-BMP (Basic Metabolic Panel-BMET) (80048-METABOL) - Signed 

## 2010-07-22 ENCOUNTER — Other Ambulatory Visit (HOSPITAL_COMMUNITY): Payer: Self-pay | Admitting: Internal Medicine

## 2010-07-22 DIAGNOSIS — Z1231 Encounter for screening mammogram for malignant neoplasm of breast: Secondary | ICD-10-CM

## 2010-07-22 NOTE — Assessment & Plan Note (Signed)
[facility]                            CARDIOLOGY OFFICE NOTE   CHEN, HOLZMAN                      MRN:          [id number]  DATE:09/28/2007                            DOB:          02-14-1951    [name] [name] is a very pleasant 60 year old female who has a history of  congestive heart failure, felt secondary to diastolic dysfunction and  paroxysmal atrial fibrillation in the setting of hyperthyroidism and  mild bradycardia.  Since I last saw her, she is doing extremely well.  There is no dyspnea, chest pain, palpitations, or syncope and there is  no pedal edema.  Note, she had her last echocardiogram performed on  February 04, 2006, that showed normal LV function and trivial mitral  regurgitation and tricuspid regurgitation.  She has also had a previous  Myoview performed on February 06, 2005, that showed no ischemia and an  ejection fraction of 63%.   Medications Include:  1. Aspirin 325 mg tablets one-half p.o. daily.  2. Lasix 20 mg p.o. daily.  3. Amlodipine 5 mg p.o. daily.  4. Naprosyn 375 mg p.o. daily.  5. Singulair.  6. Calcium.  7. Omeprazole.  8. Levothyroxine.  9. Cozaar 100 mg p.o. daily.  10.Benzonatate.  11.Nabumetone.  12.Detrol.   Her physical exam shows a blood pressure of 108/72 and pulse of 63.  She  weighs 172 pounds.  Her HEENT is normal.  Her neck is supple with no  bruits.  Her chest is clear.  Cardiovascular exam is regular rate and  rhythm.  Abdominal exam shows no tenderness.  Her extremities show no  edema.   Her electrocardiogram shows a sinus rhythm at a rate of 56.  The axis is  normal.  There is subtle lateral T-wave inversion.  It is unchanged from  previous.   DIAGNOSES:  1. History of congestive heart failure secondary to diastolic      dysfunction - she will continue on her present dose of Lasix and we      will check a BMET today.  2. Hypertension - blood pressure is adequately controlled on  present      medications.  3. Chronic obstructive pulmonary disease.  4. History of bradycardia - her previous treadmill had showed      chronotropic competence.  5. History of hyperthyroidism, status post thyroidectomy.  6. History of atrial fibrillation in the setting of hyperthyroidism -      she remains in sinus rhythm.   Ms. Shreeve is doing well, and I have asked her to follow up with her  primary care physician concerning her medical problems.  We will see her  back on an as-needed basis.     Madolyn Frieze Jens Som, MD, [facility]  Electronically Signed    BSC/MedQ  DD: 09/28/2007  DT: 09/28/2007  Job #: [id number]   cc:   Dala Dock

## 2010-07-22 NOTE — Assessment & Plan Note (Signed)
[facility]                            CARDIOLOGY OFFICE NOTE   LILYAUNA, MIEDEMA                      MRN:          [id number]  DATE:09/15/2006                            DOB:          [birth year]    [name] [name] is a very pleasant 60 year old female who has a history of  CHF secondary to diastolic dysfunction, paroxysmal atrial fibrillation  in a setting of hypothyroidism and mild bradycardia.  Since I last saw  her she is doing extremely well.  She denies any dyspnea, chest pain,  palpitations or syncope.  There is no pedal edema.  She has not had  chest pain.   MEDICATIONS:  Include:  1. Aspirin 325 mg tablets 1/2 p.o. daily.  2. Lasix 20 mg p.o. daily.  3. Amlodipine 5 mg p.o. daily.  4. Naproxen 375 mg p.o. daily.  5. Singulair 10 mg p.o. daily.  6. Calcium.  7. Colace.  8. Omeprazole.  9. Levothyroxine 112 mcg p.o. daily.  10.Cozaar 50 mg p.o. daily.  11.Eye drops.   PHYSICAL EXAMINATION:  VITAL SIGNS:  Shows a blood pressure of 140/91  and the pulse is 50.  HEENT:  Normal.  NECK:  Supple.  CHEST:  Clear.  CARDIOVASCULAR:  There is a bradycardic rate but a regular rhythm.  ABDOMEN:  Benign.  EXTREMITIES:  No edema.   TEST DATA:  Her electrocardiogram shows a sinus bradycardia at a rate of  49. There are nonspecific T-wave changes.   DIAGNOSES:  1. History of congestive heart failure felt secondary to diastolic      dysfunction - we will continue with her present dose of Lasix.  2. Hypertension:  Her blood pressure is elevated today. I have asked      her to increase her Cozaar to 100 mg p.o. daily.  I will check a      BMET in 1 week to follow potassium and renal function.  3. Chronic obstructive pulmonary disease per her primary care      physician.  4. History of bradycardia:  She did have an exercise treadmill      previously that demonstrated chronotropic competence.   I also note her most recent echocardiogram performed  in November 2007  showed normal left ventricular function.  We will see her back in  approximately 12 months.     Madolyn Frieze Jens Som, MD, [facility]  Electronically Signed    BSC/MedQ  DD: 09/15/2006  DT: 09/15/2006  Job #: [id number]   cc:   [facility]

## 2010-07-25 NOTE — Discharge Summary (Signed)
[name], [name]               ACCOUNT NO.:  [id number]   MEDICAL RECORD NO.:  [id number]          PATIENT TYPE:  INP   LOCATION:  3708                         FACILITY:  [facility]   PHYSICIAN:  Hettie Holstein, D.O.    DATE OF BIRTH:  [birth year]   DATE OF ADMISSION:  07/13/2005  DATE OF DISCHARGE:                                 DISCHARGE SUMMARY   PRIMARY CARE PHYSICIAN:  [facility].   CARDIOLOGIST:  Dr. Rollene Rotunda.   REASON FOR ADMISSION:  Shortness of breath for one and a half weeks and some  intermittent chest pain.   PRINCIPAL DIAGNOSIS:  Thyrotoxicosis with no known goiter.  Scheduled for  thyroidectomy on August 25, 2005.  This admission, she was felt to be  hyperthyroid with T4 at 6.12 and T3 at 13.3.  Her TSH in addition was very  low at 0.005.  We did contact Dr. Remus Blake office for consultation; however,  this was declined due to unpaid balance.  In any event, we have initiated a  PTU while she was here.  She has had a CT scan which eliminates the  possibility of RAI ablative therapy.   ADDITIONAL DIAGNOSES:  1.  Elevated BNP, status post inpatient evaluation and management of      congestive heart failure felt to be diastolic in nature.  She underwent      2-D echocardiogram revealing preserved left ventricular function with      moderate mitral regurgitation.  She was evaluated by Dr. Rollene Rotunda      during this admission and noted at rest to have heart rates in the 40s      despite withholding beta blockers and very minimal response to activity      with heart rate not going above 60 and the expectation that she will      likely need a pacemaker as she was symptomatic with activity.  2.  Right lower lobe pneumonia, status post Avelox therapy.  Anticipated      completion date Jul 26, 2005.  Right small pleural effusion on most      recent radiograph.  3.  Anemia with normal iron panel and iron studies, normocytic.  Hemoglobin      at the time of this  dictation was 9.9.   MEDICATIONS:  1.  Currently, she is on Valium 5 mg p.o. q.8h. for anxiety.  2.  Colace q.h.s.  3.  Prophylactic dose Lovenox.  4.  Lasix 40 mg IV q.12h.  5.  Cozaar 50 mg q.24h.  6.  Avelox 400 mg p.o. q.24h.  7.  Protonix 40 mg q.24h.  8.  Inderal 40 mg has been held.  9.  PTU 100 mg p.o. t.i.d.  10. Synthroid has been held.   HISTORY OF PRESENT ILLNESS:  For full details, please refer to the H&P as  dictated by Dr. Lonia Blood.  However, briefly, [name] is  a 60 year old female with a complex history of thyroid disease evaluated in  November 2006 with a volume overload and complication of hyperthyroidism.  She had been undergoing  outpatient at that time.  She is scheduled to have a  thyroidectomy in June of this year.  She was in her usual state of health  until about two weeks ago when she began to notice shortness of breath and  central chest discomfort.  She described the pain as tearing and has  experienced dyspnea on exertion.  She has not noted any lower extremity  swelling.  She presented to the emergency department and subsequently was  admitted for further evaluation.  On hospital course, involved a CT scan of  her chest that revealed no evidence of PE, only a right effusion and some  right lower lobe airspace disease, nonspecific nodes.  Her BNP remained  elevated. She was attempted on Inderal for management of her thyrotoxicosis.  The following day, she did experience some respiratory distress that was  felt may be attributable to either the bradycardia associated with  additional beta blocker, perhaps bronchospasm since cessation and  bronchodilators as well as anxiolytics.  She has improved.  In addition, she  was started on nitrates for assist in relaxation.  She was evaluated by  cardiology with recommendations as noted including Holter monitoring or  telemetry to test the possibility if she will need a pacemaker.  The timing   of this is at the discretion of [facility].   A final addendum will be dictated at the time of discharge.      Hettie Holstein, D.O.  Electronically Signed     ESS/MEDQ  D:  07/19/2005  T:  07/19/2005  Job:  [id number]   cc:   Rollene Rotunda, M.D.  [address]  [address]  [place]  [place] [address]   [facility]

## 2010-07-25 NOTE — Discharge Summary (Signed)
[name] [name], [name] [name]               ACCOUNT NO.:  [id number]   MEDICAL RECORD NO.:  [id number]          PATIENT TYPE:  INP   LOCATION:  4737                         FACILITY:  [facility]   PHYSICIAN:  C. Ulyess Mort, M.D.DATE OF BIRTH:  [birth year]   DATE OF ADMISSION:  01/30/2005  DATE OF DISCHARGE:  02/07/2005                                 DISCHARGE SUMMARY   DISCHARGE DIAGNOSES:  1.  Hyperthyroidism.  2.  Thyroid nodule.  3.  Congestive heart failure.  4.  Normocytic anemia.  5.  Hypertension.  6.  Chronic obstructive pulmonary disease?  7.  Transient paroxysmal atrial fibrillation.  8.  Unstable angina.   FOLLOWUP:  In the outpatient department with Dr. Liliane Channel on February 10, 2005,  at 2:30 p.m. At this time:  1.  The patient is to meet with Rudell Cobb for financial assistance, to      obtain medications especially Tapazole.  2.  Assign primary care physician for hyperthyroidism management (patient      initially went to [facility] and also was seen by an endocrinologist,      but has now been removed from the clinic secondary to no insurance.  3.  Follow up with cardiologist, [place], for congestive heart failure and      follow up with Dr. Jens Som.  4.  Follow up with Dr. Ezzard Standing (ENT for nodule excision), the patient is      euthyroid.  5.  The patient needs per feeding tube with DLCO to determine if she really      has COPD or some other pulmonary diagnosis.   CONSULTATIONS:  Dr. Jens Som with [facility].   PROCEDURES DONE THIS ADMISSION:  1.  FNAC done on February 02, 2005, showing questionable malignant cells and      recommendation was to follow up at next session.  2.  Transesophageal echocardiography done on February 03, 2005, showing      normal left ventricular function, moderate left atrial enlargement, no      atrial thrombus, moderate right atrial enlargement, mild to moderate      right ventricular enlargement with mildly reduced function. No    pericardial effusion. Mild atherosclerosis of the aorta. Normal aortic      function. No AS/AI. Mildly thickened mitral valve with moderate MR. Mild      tricuspid regurgitation.   IMAGES DONE THIS ADMISSION:  Chest x-ray done on January 30, 2005, showing  cardiac enlargement with right pleural effusion and mild edema. CT angiogram  on January 31, 2005, was negative for pulmonary embolus and there was a  moderate right pleural effusion.  A carotid Doppler was done on February 06, 2005, on the right side showing no significant ICA stenosis. The left side  showed 40% to 60% ICA stenosis; however, the study was technologically  difficult.   HISTORY OF PRESENT ILLNESS:  [name] [name] is a 60 year old African American  woman with hypothyroidism, and last T4 being increased with TSH decreased in  October 2006, and congestive heart failure secondary to hyperthyroidism. She  presents with three to four months  of worsening shortness of breath. She can  walk from her house to car, but gets short of breath while she does so. She  presented to urgent care after which they sent her here. She also complains  of intermittent chest pain over three to four months, retrosternal and  exertional, but not necessarily associated with shortness of breath. She  denies chest pain at rest or radiation of the pain. Other complaints are  that her feet and legs swell when she does not take her Lasix. It has been  three days since she has not taken her Lasix because the patient says she  could not go to [employer] and get it.   ALLERGIES:  PENICILLIN.   PAST MEDICAL HISTORY:  1.  Congestive heart failure with a 2-D echocardiogram in October 2006      showing an ejection fraction of 35% to 55%.  No left ventricular wall      motion abnormalities. Mild mitral regurgitation. Left atrial      enlargement. Right atrial dilatation. Right ventricular dilatation.  2.  History of hyperthyroidism.  3.  Hysterectomy.  Partial done in 1989.  4.  Hypertension. Not on any medication.   MEDICATIONS:  1.  Furosemide 40 mg daily.  2.  Ferrous sulfate 325 mg daily.   SOCIAL HISTORY:  The patient is a former smoker; she smoked around one pack  per day for the past 30 years. She does not drink any alcohol and has never  used illegal drugs.   SOCIAL HISTORY:  She is widowed and education is through the 12th grade. She  does not have any insurance and is self-pay. She is lives alone in  [place].   FAMILY HISTORY:  Her mother is deceased, but does not know the cause of  death. She has three sisters and four brothers who are healthy and she has  one daughter who is 58 years old and a son who is 70 years old.   REVIEW OF SYSTEMS:  She has weight loss of 15 pounds for the past four  months. Fatigue, chest pain, palpitations, dyspnea, sleep intolerance, hair  loss, itching, pallor, bruising, nausea, vomiting, diarrhea, headache,  dizziness, depression, and anxiety.   PHYSICAL EXAMINATION:  VITAL SIGNS: Temperature 97.7, blood pressure 155/83,  pulse 80, respiratory rate 20, O2 saturation 99% on room air.  GENERAL APPEARANCE: The patient is short of breath, speaking in fragmented  sentences.  HEENT: Eyes are equal, round, and reactive to light. Extraocular movements  intact. Anicteric. The patient is wearing glasses. Oropharynx is clear.  Dentures are upper teeth.  NECK: Supple.  LUNGS: Clear to auscultation bilaterally. No rales or wheezes.  HEART: Regular rate and rhythm with no murmurs, rubs, or gallops.  ABDOMEN: Soft, nontender, nondistended. Bowel sounds present.  EXTREMITIES:  1 to 2+ pedal edema up to her knees.  NEUROLOGIC: Alert and oriented times three. No focal neurologic deficits.  PSYCHIATRIC: Appropriate.   LABORATORY DATA:  Sodium 141, potassium 4.5, chloride 111, bicarbonate 21.5,  BUN 13, creatinine 0.8, glucose 104. Hemoglobin 10.2, hematocrit 30.3, MCV 80.0, RDW 16%.  Peripheral  smear showing __________, schistocytes, and  target cells. White cell count 5.6, ANC 56, platelet count 134,000. LDH 161.  Cardiac enzymes times three were negative. TSH 0.004, T4 9.13, T3 uptake  67.1. Thyroglobulin antibody study 36.3. Thyroperoxidase antibody was  antibody 5280.  BNP 8805.   PROBLEM LIST:  1.  Hyperthyroidism. The patient has a known history of hyperthyroidism and  prior tests in October 2006 showed an elevated T4 and a decreased TSH      level. She was on treatment with pills by Dr. Lucianne Muss, but she lost      insurance and therefore has dropped out for the past two years. When the      patient came in her TSH was 0.004, T4 9.13, and T3 uptake was 67.1. On      this admission, she was started initially on a beta blocker that is      metoprolol 25 mg b.i.d., thyroglobulin antibodies 36.4, thyroperoxidase      antibody was 5280. Radioactive uptake scan could not be done as the      patient had a spiral CT with contrast before and therefore could not be      done. Apparently radioactive uptake iodine scan can be done only six      weeks after contrast has been given. Radioactive uptake scan was to be      done to differentiate between Grave's versus thyroiditis. However since      it could not be done she was started on Tapazole t.i.d.  2.  Thyroid nodule. FNAC was done in the hospital in radiology on February 02, 2005, with cytology showing malignant cells (?). They recommended      follow up with excision.  Surgery was called and Dr. Ezzard Standing was      consulted. The nodule apparently cannot be removed until the patient      becomes euthyroid. So until now, biopsy has been deferred.  3.  Congestive heart failure. Etiology unclear. Initially thought to be      secondary to hyperthyroidism, but unsure. Echocardiogram done on      January 15, 2005, showed marked right ventricular and right atrial      enlargement along with left atrial enlargement. TEE and TTE findings  for      right heart and left atrial dilatation as well. Her BNP was 805. Chest x-      ray showed cardiac enlargement with right pleural effusion and mild      edema. The patient was volume overloaded and therefore was treated with      Lasix. The patient responded appropriately.  4.  Normocytic anemia thought to be secondary to chronic disease being      hypothyroidism. Hemoglobin 10.2 and MCV of 80.  Ferritin 118, retic      count 1.6%, haptoglobin 52. Smear showed schistocytes and target cells.      Hemoccult test was positive, so therefore the patient may need      colonoscopy as an outpatient.  5.  Hypertension secondary to hyperthyroidism. The patient was treated with      metoprolol which take care of hyperthyroidism as well.  6.  Chronic obstructive pulmonary disease (?). The patient has a history of      smoking and drinking with shortness of breath. The shortness of breath      could have been secondary to COPD exacerbation or the CHF exacerbation.     She was treated empirically with albuterol and Atrovent nebulizers.      However, pulmonary function tests have never been done on her and would      like to pursue this as an outpatient.  7.  Transient paroxysmal atrial fibrillation on February 01, 2005. The      patient was asymptomatic. Cardiology consult was called and as per them  atrial fibrillation carries a high risk of thromboembolism in patient's      with hyperthyroidism and that treatment will be deferred until      hyperthyroidism was completely treated.  8.  Unstable angina relieved with nitroglycerin. Cardiolite was done during      the patient's hospital stay which was negative.   DISCHARGE VITALS:  Temperature 97.7, blood pressure 130/64, pulse 59,  respiratory rate 20, O2 saturation 100% on room air.   DISCHARGE LABS:  Hemoglobin 10.4, hematocrit 30.4, white blood cell count  6.0, platelet count 135,000. Sodium 136, potassium 4.0, chloride 107,   bicarbonate 24, BUN 14, creatinine 0.7, glucose 93, calcium 9.0. ABG done on  November 30th showed pH of 7.45, PCO2 30, PO2 91, and bicarbonate 23.  Fasting lipid panel showed total cholesterol of 66, triglycerides 52, HDL  90, LDL 37, and VLDL of 10.      Yetta Barre, M.D.    ______________________________  C. Ulyess Mort, M.D.    SS/MEDQ  D:  02/09/2005  T:  02/09/2005  Job:  [id number]   cc:   Olga Millers, M.D. [facility]  [address]  [address]  [place]  [place] [address]   Sandria Bales. Ezzard Standing, M.D.  [address], [address]  [place]  [place] [address]   [facility]

## 2010-07-25 NOTE — Assessment & Plan Note (Signed)
[facility]                              CARDIOLOGY OFFICE NOTE   JAMIESON, LISA                      MRN:          [id number]  DATE:09/30/2005                            DOB:          October 29, 1950    Mrs. Alling returns for followup today.  Please refer to my note of Aug 04, 2005 for details.  Since I last saw her, she does have some dyspnea on  exertion, but there is no orthopnea, PND, pedal edema, palpitations,  presyncope, syncope, or chest pain.  The medications are unclear at present,  as she did not bring these and does not recall what she is taking.   PHYSICAL EXAMINATION:  VITAL SIGNS:  Blood pressure 114/60, pulse 76.  She  weighs 145 pounds.  NECK:  Supple.  CHEST:  Clear.  CARDIOVASCULAR:  Regular rate and rhythm.  There is a 1/6 systolic murmur at  the left sternal border.  EXTREMITIES:  Show no edema.   DIAGNOSES:  1.  History of congestive heart failure felt secondary to diastolic      dysfunction.  2.  History of moderate mitral regurgitation by previous echocardiogram.  3.  History of transient paroxysmal atrial fibrillation in the setting of      hyperthyroidism in the remote past.  4.  Hyperthyroidism.  5.  Chronic obstructive pulmonary disease.  6.  History of bradycardia.   PLAN:  Mrs. Ciano is doing reasonably well from a symptomatic standpoint.  She is scheduled for her thyroid surgery next week.  She does have some  dyspnea on exertion but attributes this to her hyperthyroidism.  Our plan  will be to let her proceed with her surgery.  Note she did have a negative  nuclear study performed in December 2006.  In approximately 3 months, she  will return, and we will plan to repeat her echocardiogram to reassess her  mitral regurgitation.  I will also schedule her to have an exercise  treadmill (she has had problems with bradycardia in the past, and this will  be to evaluate for chronotropic confidence; if she has an  inability to  increase her heart rate, then this could explain her dyspnea, and she may  benefit from a pacemaker).  We have considered Coumadin in the past, but she  has had only one episode of atrial fibrillation, and there has been a  question of compliance.  She will therefore continue with her aspirin.  I  will see her back in 3 months as outlined.                             Madolyn Frieze Jens Som, MD, [facility]    BSC/MedQ  DD:  09/30/2005  DT:  09/30/2005  Job #:  [id number]

## 2010-07-25 NOTE — Op Note (Signed)
NAMECARRERA, KIESEL               ACCOUNT NO.:  [id number]   MEDICAL RECORD NO.:  [id number]          PATIENT TYPE:  OIB   LOCATION:  0098                         FACILITY[facility]  [facility]   PHYSICIAN:  Gita Kudo, M.D. DATE OF BIRTH:  [birth year]   DATE OF PROCEDURE:  10/06/2005  DATE OF DISCHARGE:                                 OPERATIVE REPORT   __________   OPERATIVE PROCEDURE:  Total thyroidectomy.   SURGEON:  [place].   ASSISTANT SURGEON:  [place].   ANESTHESIA:  General endotracheal.   PREOPERATIVE DIAGNOSIS:  Hyperparathyroidism, multiple thyroid nodules.   POSTOPERATIVE DIAGNOSIS:  Hyperparathyroidism, multiple thyroid nodules.   CLINICAL SUMMARY:  This 60 year old female has been followed in my clinic  for several months.  We had been getting her ready for surgery.  She it was  kindly seen by Dr. Romero Belling who has gotten her as euthyroid as he can.  Dr. Jens Som has her cardiac status tuned up.  She comes in for total  thyroidectomy.   OPERATIVE FINDINGS:  The patient had a large multinodular gland.  It was  very vascular.  The nerves and parathyroid glands identified and not  injured.   OPERATIVE PROCEDURE:  Under satisfactory general endotracheal anesthesia,  the patient was positioned, prepped and draped in the standard fashion.  A  transverse neck incision was made and carried down to the platysma flap.  The flaps were developed superiorly and inferiorly, and self-retaining  retractors placed.  Midline opened and the strap muscles retracted on the  right side.  The right lobe was removed first.  It was rotated medially and  loose areolar tissue divided by cautery.  Multiple vessels encountered and,  beginning at the inferior pole, they were taken between clips or ties of  silk.  Carried up to the superior pole which was taken down between ties of  2-0 silk.  Gland rotated medially and cautery used to remove the gland from  the undersurface of the trachea.   Bleeding was controlled by the cautery,  and then the dissection was carried over to the left side, taking down the  pyramidal lobe and the isthmus.  Gauze packs were then placed in the thyroid  bed on the left side approach.   Similar dissection was used and the gland totally removed.   Hemostasis was checked, made secure by suture and cautery and clip.  The  wound was then lavaged with saline.  Following this, Surgicel placed in the  bed of each wound, and then the midline closed with running 3-0 Vicryl,  platysma with interrupted 3-0 Vicryl, subcu with running 4-0 Vicryl, Steri-  Strips for skin.  Sterile absorbent dressings were applied.  The patient  went to the recovery room, from the operating room, in good condition  without complication.           ______________________________  Gita Kudo, M.D.     MRL/MEDQ  D:  10/06/2005  T:  10/06/2005  Job:  [id number]   cc:   Gregary Signs A. Everardo All, M.D. [facility]  [address] [facility]  [place]  [place] [address]   Yetta Barre, M.D.  Fax: [phone]   C. Ulyess Mort, M.D.  Fax: [phone]   Maurice March, M.D.  Fax: [phone]   Olga Millers, M.D. [facility]  [address]  [address]  [place]  [place] [address]

## 2010-07-25 NOTE — Assessment & Plan Note (Signed)
[name] [name]                              CARDIOLOGY OFFICE NOTE   [name] [name], [name] [name]                      MRN:          [id number]  DATE:12/24/2005                            DOB:          [birth year]    [name] [name] returns for followup today.  Since I last saw her, she has had  her thyroidectomy.  She also complains of dyspnea on exertion, but there is  no orthopnea, PND, pedal edema, palpitations, presyncope, syncope or chest  pain.   MEDICATIONS:  1. Colace.  2. Levothroid 75 mcg tablets three p.o. daily.  3. Aspirin 325 mg tablets one-half p.o. daily.  4. Cozaar 50 mg two daily.  5. Tramadol.  6. Lasix 20 mg p.o. daily.  7. Singulair.  8. Naprelan.  9. Calcarb D.   PHYSICAL EXAMINATION:  Shows a blood pressure that is elevated at 148/89.  Her pulse is 51.  NECK:  Supple.  She is status post thyroidectomy.  CHEST:  Clear.  CARDIOVASCULAR:  Exam reveals a regular rhythm.  EXTREMITIES:  Show no edema.   Her electrocardiogram shows a sinus bradycardia at a rate of 47.  There are  no significant ST changes.   DIAGNOSES:  1. History of congestive heart failure felt secondary to diastolic      dysfunction.  2. History of moderate mitral regurgitation by previous echocardiogram.  3. History of transient paroxysmal atrial fibrillation in the setting of      hypothyroidism in the remote past.  4. Status post thyroidectomy.  5. Chronic obstructive pulmonary disease.  6. Dyspnea.  7. History of bradycardia.   PLAN:  [name] [name] is complaining of dyspnea of uncertain etiology.  We will  plan to repeat her echocardiogram to reassess her left ventricular function  and her mitral regurgitation.  I will also increase her Cozaar to 100 mg  p.o. daily for better blood pressure control.  We will check a BMET and a  BNP in 1 week.  If her BNP is elevated, then she may need a higher dose of  diuretics.  Finally, we will schedule her for an exercise  treadmill.  She  has had bradycardia in the  past.  If she demonstrates chronotropic incompetence, then she may benefit  from a pacemaker.  I will see her back in approximately 4 to 6 weeks to  review the above.    ______________________________  Madolyn Frieze. Jens Som, MD, [facility]    BSC/MedQ  DD: 12/24/2005  DT: 12/26/2005  Job #: [id number]

## 2010-07-25 NOTE — H&P (Signed)
NAMESEILA, LISTON               ACCOUNT NO.:  [id number]   MEDICAL RECORD NO.:  [id number]          PATIENT TYPE:  INP   LOCATION:  1825                         FACILITY:  [facility]   PHYSICIAN:  Lonia Blood, M.D.DATE OF BIRTH:  10/03/1950   DATE OF ADMISSION:  07/13/2005  DATE OF DISCHARGE:                                HISTORY & PHYSICAL   PRIMARY CARE PHYSICIAN:  [facility].   CHIEF COMPLAINT:  Shortness of breath x1-1/2 weeks with chest pain during  that time.   HISTORY OF PRESENT ILLNESS:  Ms. Adison Reifsteck is a very pleasant 60-year-  old female with a complex history of thyroid disease including  hyperthyroidism and hypothyroidism.  She was admitted to the hospital in  November of 2006 with fluid overload and complications related to  hyperthyroidism.  She has been undergoing outpatient treatment since that  time.  She is scheduled to have a thyroidectomy in June of this year.  She  was in her usual state of health until approximately one and a half weeks  ago.  Then she began to notice shortness of breath and central chest pain.  She describes the chest pain as a tearing pain that is most worse with cough  or a twisting movement.  She noted dyspnea on exertion.  These symptoms have  all worsened over the last week and a half.  She has not noted lower  extremity swelling as she had in her admission November 2006.  Because her  symptoms were worsening, she presented to her primary care physician at  [facility] today and was brought to the ER via EMS.   In the emergency room, the patient had an EKG and lab work as well as a  chest x-ray.  Chest x-ray reveals possible mild CHF but no significant  pulmonary edema.  The patient reports that her shortness of breath is  improving with O2 delivery but that her chest pain is still present when she  turns.   REVIEW OF SYSTEMS:  Comprehensive review of systems is unremarkable with the  exception of elements noted in the  history of present illness above.   PAST MEDICAL HISTORY:  1.  Goiter, scheduled for thyroidectomy in June with previous Tapazole use      and now concurrent Synthroid use.  2.  CHF diagnosed in November 2006 with an echocardiogram revealing normal      LV function but diastolic dysfunction and possible pulmonary      hypertension.  3.  Paroxysm atrial fibrillation during previous hospitalization, likely      secondary to hyperthyroidism.  4.  Status post hysterectomy (partial) in 1989.  5.  The patient with one pack per day x30 years of tobacco abuse.  6.  Chronic normocytic anemia with hemoglobin 10.2 in December 2006.  7.  Negative mammogram March 2007.  8.  Negative cardiac December 2006.   MEDICATIONS:  1.  Iron 325 mg daily.  2.  Colace 1 daily.  3.  Metoprolol 25 mg b.i.d.  4.  Synthroid 75 mcg daily.   ALLERGIES:  PENICILLIN.   FAMILY  HISTORY:  Noncontributory to this admission.   SOCIAL HISTORY:  The patient does not drink.  She does not use illicit  drugs.  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  She lives in  [place].   LABORATORY DATA:  BNP is markedly elevated at 1165.  Hemoglobin is low at 11  with MCV of 85 and white count are normal.  BMET is normal.  Alkaline  phosphate is elevated at 206.  Albumin is low at 3.0 and comprehensive  metabolic panel is otherwise unremarkable.  A 12-lead EKG reveals sinus  bradycardia.  Point of care cardiac markers are negative x1.  INR is 1.4,  PTT is 32.  Chest x-ray reveals cardiomegaly with small right pleural  effusion and possible mild CHF that is not apparent to my evaluation.   PHYSICAL EXAMINATION:  VITAL SIGNS:  Temperature 97.5, blood pressure  109/64, heart rate 55, respiratory rate 22, O2 saturation 100% on three  liters per minute nasal cannula.  GENERAL:  Well-developed, well-nourished female in no acute respiratory  distress.  HEENT:  Normocephalic and atraumatic.  Pupils are equal, round, and  reactive  to light and accommodation.  Extraocular movements intact bilaterally.  OC/OP clear.  NECK:  JVD to the angle of the jaw when lying at 30 degrees.  LUNGS:  Clear to auscultation bilaterally without wheezes or rhonchi.  CARDIOVASCULAR:  Regular rate and rhythm without rub or gallop and a 2/6  holosystolic murmur.  Normal S1 and S2.  ABDOMEN:  Soft.  Bowel sounds present.  No hepatosplenomegaly, no rebound  and no ascites.  EXTREMITIES:  No significant clubbing, cyanosis, or edema bilateral lower  extremities.  NEUROLOGICAL:  Nonfocal neurologic exam.  Cranial nerves intact.  Preserved  strength throughout.   IMPRESSION/PLAN:  1.  Shortness of breath with pleuritic chest pain:  I am concerned about the      possibility of pulmonary embolism with right-sided heart failure and      right-sided heart strain in this patient.  My pretest probability is      sufficiently high that I will treat the patient empirically with full      dose Lovenox.  CT scan of the chest will be obtained.  If the patient's      CT fails to reveal pulmonary embolism then we will decrease her Lovenox      to 40 mg daily for deep vein thrombosis prophylaxis.  We must also      consider the possibility of congestive heart failure related to      patient's erratic thyroid status.  We will recheck an echocardiogram      given the multiple abnormal findings in November 2006.  2.  Thyroid dysfunction:  It is difficult to piece together the patient's      thyroid history.  It does appear though that she initially presented      with hyperthyroidism, received treatment for such and is now      hypothyroidism.  She has does have an appreciable goiter and a fine      needle aspiration raises questions of atypical cells.  The patient is      scheduled for thyroidectomy in June of this year.  I will obtain TSH to      assure that the patient is not suffering with severe thyroid issues at      the present time. 3.   Chronic normocytic anemia:  The patient's hemoglobin is at  her baseline      at the present time. There is no evidence of acute blood loss.  We will      continue iron therapy and made no further changes to her treatment      regimen.     Lonia Blood, M.D.  Electronically Signed    JTM/MEDQ  D:  07/13/2005  T:  07/13/2005  Job:  [id number]   cc:   Dala Dock

## 2010-07-25 NOTE — Consult Note (Signed)
[name] [name], [name] [name]               ACCOUNT NO.:  [id number]   MEDICAL RECORD NO.:  [id number]          PATIENT TYPE:  INP   LOCATION:  3708                         FACILITY:  [facility]   PHYSICIAN:  Rollene Rotunda, M.D.   DATE OF BIRTH:  1951-03-07   DATE OF CONSULTATION:  07/17/2005  DATE OF DISCHARGE:                                   CONSULTATION   PRIMARY CARE PHYSICIAN:  [facility].   REASON FOR CONSULTATION:  Evaluate patient with dyspnea and congestive heart  failure.   HISTORY OF PRESENT ILLNESS:  The patient is a 60 year old African American  female. She has been seen in the past for paroxysmal atrial fibrillation  related to hyperthyroidism most likely. She has also had heart failure with  a preserved ejection fraction. She has had moderate mitral regurgitation.  She has had some chest discomfort, but had a negative stress perfusion  student in December 2006 with no evidence of ischemia or infarct, and a well-  preserved ejection fraction. She is being evaluated for thyroidectomy and is  having management of her hyperthyroidism.   She has had two weeks of dyspnea slowly progressive. She presented on Jul 13, 2005, with questionable right lower lobe pneumonia. She was not found to  have a pulmonary embolism on CT.  She has been managed with improvement in  her symptoms with Xopenex, Avelox, and also with Lasix. She did present with  an elevated BNP of 1681. She has had negative cardiac enzymes. Aside from QT  prolongation she had no acute ST segment changes. Over the course of the  next few days she has had 10 pounds of weight loss and was progressing with  less dyspnea. She did have an echocardiogram which demonstrated an well  preserved ejection fraction, hyperdynamic left ventricle, moderate mitral  regurgitation, and mild aortic stenosis.   Today the patient had increasing dyspnea. She was treated with nitrites,  Valium, and morphine. She also was started on Cozaar. I  do not see a  hypertensive urgency. She also reports being given a breathing treatment.  She has had improvement in her symptoms and is back to baseline. She is not  describing chest discomfort, neck discomfort, arm discomfort, activity  induced nausea, vomiting, or diaphoresis. There have been on  tachyarrhythmias. In fact she has had a heart rate in the 40s on telemetry  consistently. She has had no sustained pauses.   PAST MEDICAL HISTORY:  Paroxysmal atrial fibrillation questionably related  to hyperthyroidism, hyperthyroidism, thyroid nodule, COPD, mitral  regurgitation, heart failure with a preserved ejection fraction, chest pain  with a negative Cardiolite in December 2006 with a well-preserved ejection  fraction, anemia of chronic disease.   PAST SURGICAL HISTORY:  Partial hysterectomy.   ALLERGIES:  PENICILLIN.   MEDICATIONS (PRIOR TO ADMISSION):  Iron, Colace, metoprolol 25 mg b.i.d.,  and Cipro 75 mcg daily.   SOCIAL HISTORY:  The patient lives alone. She goes to church. She is a  widow. She does not drink or use illicit drugs.   FAMILY HISTORY:  Noncontributory.   REVIEW OF SYSTEMS:  As stated in the HPI. Otherwise, negative for other  systems.   PHYSICAL EXAMINATION:  GENERAL: The patient is in no distress.  VITAL SIGNS: Blood pressure 127/62,  heart rate 98 and regular, temperature  97.8, 94% saturation on two liters.  HEENT: Eyes unremarkable. Pupils equal, round, and reactive to light. Fundi  not visualized.  She is edentulous.  NECK: Jugular venous distention for 14 cm or 45 degrees. Carotid upstrokes  brisk and symmetrical. No bruits or thyromegaly.  LYMPHATICS: No cervical, axillary, or inguinal lymphadenopathy.  LUNGS: Clear to auscultation bilaterally.  BACK:  No costovertebral angle tenderness.  CHEST: Unremarkable.  HEART: PMI not displaced or sustained. S1 and S2 within normal limits. No  S3. A 3/6 systolic murmur radiating at the aortic outflow tract  and  transmitted to the carotids; a 2/6 holosystolic murmur at the apex; no  diastolic murmurs.  ABDOMEN: Flat, positive bowel sounds, normal in frequency and pitch. No  bruits, no rebound,  no guarding. Midline pulses. No masses, hepatomegaly,  or splenomegaly.  SKIN: No rash or nodules.  EXTREMITIES: 2+ pulses. No cyanosis or clubbing. No edema.  NEUROLOGIC: Oriented to person, place, and time. Cranial nerves II-XII  grossly intact. Motor grossly intact.   ASSESSMENT/PLAN:  1.  Dyspnea. The patient does have dyspnea with a component of pulmonary      edema, according to chest x-ray. She has had a markedly elevated BNP on      admission. She did respond to therapies that included diuretics. She may      have a component of pneumonia as well. She had an acute exacerbation,      but it is not clear to me that this was excessive fluid or hypertensive      urgency. She did respond to bronchodilators apparently.  2.  At this point I would repeat a chest x-ray in the morning. Will check      another BNP level to see if this has come down. She should continue with      careful assessment of her I&Os. We will hold her Inderal because of her      bradyarrhythmia. She needs education about salt restriction, fluid      restriction, and daily weights. An anginal workup is not indicated at      this point. She needs to continue on telemetry.           ______________________________  Rollene Rotunda, M.D.     JH/MEDQ  D:  07/17/2005  T:  07/19/2005  Job:  [id number]   cc:   Lonia Blood, M.D.  Fax: [phone]   [facility]

## 2010-07-25 NOTE — Discharge Summary (Signed)
NAMEJASELLE, [name] [name]               ACCOUNT NO.:  [id number]   MEDICAL RECORD NO.:  [id number]          PATIENT TYPE:  INP   LOCATION:  3708                         FACILITY:  [facility]   PHYSICIAN:  Lonia Blood, M.D.      DATE OF BIRTH:  1950/09/25   DATE OF ADMISSION:  07/13/2005  DATE OF DISCHARGE:  07/21/2005                                 DISCHARGE SUMMARY   PRIMARY CARE PHYSICIAN:  [facility].   Please refer to dictated discharge summary on May 13 by Dr. Theodoro Grist.   ADDENDUM:  The patient is being discharged today after consultations by Dr.  Jens Som who has been her primary cardiologist.  The patient has continued  to be bradycardic; however, her Inderal was stopped.  Off beta blocker, her  heart rate is somewhere around 60.  Her final discharge medications,  therefore, include PTU  100 mg p.o. t.i.d., Cozaar 50 mg daily, Avelox 400  mg daily for 5 more days, Colace 100 mg at night, Valium 5 mg q.8h. p.r.n.,  Lasix 40 mg b.i.d., KCl 20 mEq b.i.d., Aspirin  325 mg daily.  The patient  is to be followed by Dr. Jens Som in about 2 weeks in the office.  If she  continues to be bradycardiac at that point off beta blockers, then a  pacemaker could be considered.  She is also to follow up with her physician  at [facility].  Her Synthroid has been held as well as her beta blockers  at this point and further management will depend on her situation with she  is seen at followup as an outpatient.      Lonia Blood, M.D.  Electronically Signed     LG/MEDQ  D:  07/21/2005  T:  07/21/2005  Job:  [id number]

## 2010-07-25 NOTE — Assessment & Plan Note (Signed)
[facility]                            CARDIOLOGY OFFICE NOTE   NAMIYAH, [name] [name]                      MRN:          [id number]  DATE:02/04/2006                            DOB:          1950-08-15    EXERCISE TREADMILL.   Ms. Acocella is a 60 year old female who has a history of bradycardia.  There was a question whether she had chronotropic incompetence and we  therefore scheduled her for an exercise treadmill.   She exercised for a duration of 4 minutes and 52 seconds on the Bruce  protocol, which was equivalent of 6.8 [place].  Her heart rate at rest was  69 and increased to a maximum of 111, which was 67% of her predicted  maximum heart rate.  Her blood pressure at rest was 149/99 and increased  to 199/107, which was felt to be a hypertensive response.  She did not  have chest pain or dyspnea during the study.  It was terminated  secondary to leg fatigue.  There were no electrocardiographic changes  noted.  There were occasional PVCs noted.   FINAL INTERPRETATION:  Exercise treadmill with no chest pain or dyspnea  and no electrocardiographic changes.  She did demonstrate chronotropic  competence as her heart rate increased from 69 to 111.  I noted she also  had a hypertensive response.  We will therefore add Norvasc 5 mg by  mouth daily for better blood pressure control.  She will see me back in  January as scheduled.     Madolyn Frieze Jens Som, MD, [facility]  Electronically Signed    BSC/MedQ  DD: 02/04/2006  DT: 02/04/2006  Job #: [id number]   cc:   Drinkard, Dr.

## 2010-07-25 NOTE — Assessment & Plan Note (Signed)
[facility]                            CARDIOLOGY OFFICE NOTE   LIELA, RYLEE                      MRN:          [id number]  DATE:03/17/2006                            DOB:          12-30-1950    Mrs. Huestis returns for followup today. Since I last saw her she denies  any dyspnea on exertion, orthopnea, PND, palpitations, pre syncope, or  syncope, or chest pain. Note we did perform an echocardiogram in  November that showed normal LV function. There was trivial mitral  regurgitation. She also had an exercise treadmill to demonstrate chronic  trophic competence. Her heart rate increased from 69 to 111. She did  have a hypertensive response and we added Norvasc. Also of note that  when she was complaining of dyspnea in October we checked a BNP which  was normal at 15. Her medications include;  1. Aspirin 325 mg tablets one half p.o. q. Day.  2. Lasix 20 mg p.o. q. Day.  3. Amlodipine 5 mg p.o. q. Day.  4. Naproxen 375 mg p.o. q. Day.  5. Singulair.  6. Calcium.  7. Colace.  8. Cozaar 100 mg p.o. q. Day.  9. Levothyroxine 125 mcg p.o. q. Day.   PHYSICAL EXAMINATION:  Shows a blood pressure 144/82, and her pulse is  58. She weighs 146 pounds.  CHEST: Clear.  CARDIOVASCULAR EXAM: Regular rate and rhythm.  EXTREMITIES: Show no edema.   DIAGNOSES:  1. History of congestive heart failure felt secondary to diastolic      dysfunction.  2. History of paroxysmal atrial fibrillation in the setting of      hyperthyroidism and now status post thyroidectomy.  3. Chronic obstructive pulmonary disease.  4. History of bradycardia.   PLAN:  Mrs. Goodreau is doing well from  symptomatic standpoint. We will  continue with her present medications. Note her recent echocardiogram  showed normal left ventricular function and trivial mitral  regurgitation. Her BNP was normal. Finally we did perform and exercise  treadmill that demonstrated chronic trophic  competence. I will therefore  make no changes. I will see her back in approximately 6 months.     Madolyn Frieze Jens Som, MD, [facility]  Electronically Signed   BSC/MedQ  DD: 03/17/2006  DT: 03/17/2006  Job #: [id number]

## 2010-08-19 ENCOUNTER — Ambulatory Visit (HOSPITAL_COMMUNITY)
Admission: RE | Admit: 2010-08-19 | Discharge: 2010-08-19 | Disposition: A | Discharge status: Home / Self Care | Payer: Medicare Other | Source: Ambulatory Visit | Attending: Internal Medicine | Admitting: Internal Medicine

## 2010-08-19 DIAGNOSIS — Z1231 Encounter for screening mammogram for malignant neoplasm of breast: Secondary | ICD-10-CM

## 2010-08-20 ENCOUNTER — Other Ambulatory Visit: Payer: Self-pay | Admitting: Internal Medicine

## 2010-08-20 DIAGNOSIS — R928 Other abnormal and inconclusive findings on diagnostic imaging of breast: Secondary | ICD-10-CM

## 2010-08-27 ENCOUNTER — Ambulatory Visit
Admission: RE | Admit: 2010-08-27 | Discharge: 2010-08-27 | Disposition: A | Discharge status: Home / Self Care | Payer: Medicare Other | Source: Ambulatory Visit | Attending: Internal Medicine | Admitting: Internal Medicine

## 2010-08-27 DIAGNOSIS — R928 Other abnormal and inconclusive findings on diagnostic imaging of breast: Secondary | ICD-10-CM

## 2010-12-02 LAB — POCT I-STAT, CHEM 8
BUN: 20
Calcium, Ion: 1.17
Chloride: 110
HCT: 42
Potassium: 3.7

## 2010-12-02 LAB — CBC
HCT: 40.5
Hemoglobin: 13.8
Platelets: 189
RDW: 13
WBC: 5

## 2010-12-02 LAB — OCCULT BLOOD X 1 CARD TO LAB, STOOL: Fecal Occult Bld: NEGATIVE

## 2011-01-12 ENCOUNTER — Encounter: Payer: Self-pay | Admitting: *Deleted

## 2011-01-13 ENCOUNTER — Encounter: Payer: Self-pay | Admitting: Cardiology

## 2011-01-13 ENCOUNTER — Ambulatory Visit (INDEPENDENT_AMBULATORY_CARE_PROVIDER_SITE_OTHER): Payer: PRIVATE HEALTH INSURANCE | Admitting: Cardiology

## 2011-01-13 VITALS — BP 129/83 | HR 40 | Ht 63.0 in | Wt 167.0 lb

## 2011-01-13 DIAGNOSIS — I4891 Unspecified atrial fibrillation: Secondary | ICD-10-CM

## 2011-01-13 NOTE — Assessment & Plan Note (Signed)
Occurred in the setting of hyperthyroidism. No recurrences.

## 2011-01-13 NOTE — Assessment & Plan Note (Signed)
Managed by primary care. 

## 2011-01-13 NOTE — Progress Notes (Signed)
HPI:[name] [name] is a very pleasant  female who has a history of congestive heart failure, felt secondary to diastolic dysfunction and paroxysmal atrial fibrillation in the setting of hyperthyroidism and mild bradycardia.   Note, she had her last echocardiogram performed on February 04, 2006, that showed normal LV function and trivial mitral regurgitation and tricuspid regurgitation. She had a Myoview performed in Oct 2011 that showed soft tissue attenuation but no ischemia and an ejection fraction of 77%. I last saw her in Nov 2011. Since then the patient has dyspnea with more extreme activities but not with routine activities. It is relieved with rest. It is not associated with chest pain. There is no orthopnea, PND or pedal edema. There is no syncope or palpitations. There is no exertional chest pain.   Current Outpatient Prescriptions  Medication Sig Dispense Refill  . amLODipine (NORVASC) 5 MG tablet Take 5 mg by mouth daily.        Marland Kitchen aspirin 81 MG tablet Take 81 mg by mouth daily.        . brimonidine (ALPHAGAN P) 0.1 % SOLN every 8 (eight) hours.        . Calcium Carbonate (CALTRATE 600 PO) Take 1 tablet by mouth daily.        . furosemide (LASIX) 20 MG tablet Take 20 mg by mouth 2 (two) times daily.        Marland Kitchen levothyroxine (SYNTHROID, LEVOTHROID) 88 MCG tablet Take 88 mcg by mouth daily.        . montelukast (SINGULAIR) 10 MG tablet Take 10 mg by mouth at bedtime.        Marland Kitchen omeprazole (PRILOSEC) 20 MG capsule Take 20 mg by mouth daily.        . Travoprost, BAK Free, (TRAVATAMN) 0.004 % SOLN ophthalmic solution Place 1 drop into both eyes at bedtime.        . valsartan (DIOVAN) 160 MG tablet Take 160 mg by mouth daily.           Past Medical History  Diagnosis Date  . HYPERTENSION   . Congestive heart failure, unspecified   . BRADYCARDIA   . ATRIAL FIBRILLATION, PAROXYSMAL   . OSTEOARTHRITIS   . HYPOTHYROIDISM   . GERD   . COPD     Past Surgical History  Procedure Date  .  Thyroidectomy     History   Social History  . Marital Status: Single    Spouse Name: N/A    Number of Children: N/A  . Years of Education: N/A   Occupational History  . Not on file.   Social History Main Topics  . Smoking status: Never Smoker   . Smokeless tobacco: Not on file  . Alcohol Use: No  . Drug Use: No  . Sexually Active: Not on file   Other Topics Concern  . Not on file   Social History Narrative  . No narrative on file    ROS: no fevers or chills, productive cough, hemoptysis, dysphasia, odynophagia, melena, hematochezia, dysuria, hematuria, rash, seizure activity, orthopnea, PND, pedal edema, claudication. Remaining systems are negative.  Physical Exam: Well-developed well-nourished in no acute distress.  Skin is warm and dry.  HEENT is normal.  Neck is supple. No thyromegaly.  Chest is clear to auscultation with normal expansion.  Cardiovascular exam is regular  Rhythm and bradycardic rate Abdominal exam nontender or distended. No masses palpated. Extremities show no edema. neuro grossly intact  ECG marked sinus bradycardia at a rate of  40. Nonspecific ST changes.

## 2011-01-13 NOTE — Patient Instructions (Signed)
Your physician wants you to follow-up in: 1 year. You will receive a reminder letter in the mail two months in advance. If you don't receive a letter, please call our office to schedule the follow-up appointment.  

## 2011-01-13 NOTE — Assessment & Plan Note (Signed)
Blood pressure controlled. Continue present medications. Potassium and renal function monitored by primary care. 

## 2011-01-13 NOTE — Assessment & Plan Note (Signed)
Patient continues to be bradycardic but she has had no symptoms. No intervention warranted.

## 2011-01-21 ENCOUNTER — Other Ambulatory Visit: Payer: Self-pay | Admitting: Internal Medicine

## 2011-01-21 DIAGNOSIS — N63 Unspecified lump in unspecified breast: Secondary | ICD-10-CM

## 2011-02-12 ENCOUNTER — Ambulatory Visit
Admission: RE | Admit: 2011-02-12 | Discharge: 2011-02-12 | Disposition: A | Discharge status: Home / Self Care | Payer: PRIVATE HEALTH INSURANCE | Source: Ambulatory Visit | Attending: Internal Medicine | Admitting: Internal Medicine

## 2011-02-12 ENCOUNTER — Other Ambulatory Visit: Payer: Self-pay | Admitting: Internal Medicine

## 2011-02-12 DIAGNOSIS — N63 Unspecified lump in unspecified breast: Secondary | ICD-10-CM

## 2011-02-27 ENCOUNTER — Other Ambulatory Visit (HOSPITAL_COMMUNITY): Payer: Self-pay | Admitting: Orthopedic Surgery

## 2011-02-27 DIAGNOSIS — M25561 Pain in right knee: Secondary | ICD-10-CM

## 2011-02-27 DIAGNOSIS — M2390 Unspecified internal derangement of unspecified knee: Secondary | ICD-10-CM

## 2011-03-13 ENCOUNTER — Ambulatory Visit (HOSPITAL_COMMUNITY)
Admission: RE | Admit: 2011-03-13 | Discharge: 2011-03-13 | Disposition: A | Discharge status: Home / Self Care | Payer: PRIVATE HEALTH INSURANCE | Source: Ambulatory Visit | Attending: Orthopedic Surgery | Admitting: Orthopedic Surgery

## 2011-03-13 DIAGNOSIS — M6281 Muscle weakness (generalized): Secondary | ICD-10-CM | POA: Insufficient documentation

## 2011-03-13 DIAGNOSIS — M2240 Chondromalacia patellae, unspecified knee: Secondary | ICD-10-CM | POA: Insufficient documentation

## 2011-03-13 DIAGNOSIS — M25469 Effusion, unspecified knee: Secondary | ICD-10-CM | POA: Insufficient documentation

## 2011-03-13 DIAGNOSIS — M25561 Pain in right knee: Secondary | ICD-10-CM

## 2011-03-13 DIAGNOSIS — M25569 Pain in unspecified knee: Secondary | ICD-10-CM | POA: Insufficient documentation

## 2011-03-13 DIAGNOSIS — M2390 Unspecified internal derangement of unspecified knee: Secondary | ICD-10-CM

## 2011-07-06 ENCOUNTER — Other Ambulatory Visit: Payer: Self-pay | Admitting: Internal Medicine

## 2011-07-06 DIAGNOSIS — N63 Unspecified lump in unspecified breast: Secondary | ICD-10-CM

## 2011-08-13 ENCOUNTER — Ambulatory Visit
Admission: RE | Admit: 2011-08-13 | Discharge: 2011-08-13 | Disposition: A | Discharge status: Home / Self Care | Payer: PRIVATE HEALTH INSURANCE | Source: Ambulatory Visit | Attending: Internal Medicine | Admitting: Internal Medicine

## 2011-08-13 ENCOUNTER — Other Ambulatory Visit: Payer: Self-pay | Admitting: Internal Medicine

## 2011-08-13 DIAGNOSIS — N63 Unspecified lump in unspecified breast: Secondary | ICD-10-CM

## 2011-12-30 ENCOUNTER — Encounter (HOSPITAL_COMMUNITY): Payer: Self-pay

## 2011-12-30 ENCOUNTER — Emergency Department (HOSPITAL_COMMUNITY)
Admission: EM | Admit: 2011-12-30 | Discharge: 2011-12-31 | Disposition: A | Discharge status: Home / Self Care | Payer: PRIVATE HEALTH INSURANCE | Attending: Emergency Medicine | Admitting: Emergency Medicine

## 2011-12-30 ENCOUNTER — Emergency Department (HOSPITAL_COMMUNITY): Payer: PRIVATE HEALTH INSURANCE

## 2011-12-30 DIAGNOSIS — J4489 Other specified chronic obstructive pulmonary disease: Secondary | ICD-10-CM | POA: Insufficient documentation

## 2011-12-30 DIAGNOSIS — J449 Chronic obstructive pulmonary disease, unspecified: Secondary | ICD-10-CM | POA: Insufficient documentation

## 2011-12-30 DIAGNOSIS — E039 Hypothyroidism, unspecified: Secondary | ICD-10-CM | POA: Insufficient documentation

## 2011-12-30 DIAGNOSIS — I509 Heart failure, unspecified: Secondary | ICD-10-CM | POA: Insufficient documentation

## 2011-12-30 DIAGNOSIS — I10 Essential (primary) hypertension: Secondary | ICD-10-CM | POA: Insufficient documentation

## 2011-12-30 DIAGNOSIS — R0789 Other chest pain: Secondary | ICD-10-CM

## 2011-12-30 DIAGNOSIS — Z7982 Long term (current) use of aspirin: Secondary | ICD-10-CM | POA: Insufficient documentation

## 2011-12-30 DIAGNOSIS — M1990 Unspecified osteoarthritis, unspecified site: Secondary | ICD-10-CM | POA: Insufficient documentation

## 2011-12-30 DIAGNOSIS — I498 Other specified cardiac arrhythmias: Secondary | ICD-10-CM | POA: Insufficient documentation

## 2011-12-30 DIAGNOSIS — R071 Chest pain on breathing: Secondary | ICD-10-CM | POA: Insufficient documentation

## 2011-12-30 DIAGNOSIS — Z79899 Other long term (current) drug therapy: Secondary | ICD-10-CM | POA: Insufficient documentation

## 2011-12-30 DIAGNOSIS — K219 Gastro-esophageal reflux disease without esophagitis: Secondary | ICD-10-CM | POA: Insufficient documentation

## 2011-12-30 LAB — BASIC METABOLIC PANEL
BUN: 19 mg/dL (ref 6–23)
CO2: 24 mEq/L (ref 19–32)
Chloride: 104 mEq/L (ref 96–112)
Creatinine, Ser: 1.16 mg/dL — ABNORMAL HIGH (ref 0.50–1.10)
GFR calc Af Amer: 57 mL/min — ABNORMAL LOW (ref >90)
Glucose, Bld: 86 mg/dL (ref 70–99)
Potassium: 3.8 mEq/L (ref 3.5–5.1)

## 2011-12-30 LAB — CBC
HCT: 40.1 % (ref 36.0–46.0)
Hemoglobin: 14.1 g/dL (ref 12.0–15.0)
MCV: 87.9 fL (ref 78.0–100.0)
RDW: 13.7 % (ref 11.5–15.5)
WBC: 5.8 10*3/uL (ref 4.0–10.5)

## 2011-12-30 LAB — POCT I-STAT TROPONIN I: Troponin i, poc: 0.02 ng/mL (ref 0.00–0.08)

## 2011-12-30 LAB — D-DIMER, QUANTITATIVE: D-Dimer, Quant: <0.27 ug/mL-FEU (ref 0.00–0.48)

## 2011-12-30 MED ORDER — HYDROCODONE-ACETAMINOPHEN 5-325 MG PO TABS
2.0000 | ORAL_TABLET | Freq: Once | ORAL | Status: AC
  Administered 2011-12-30: 2 via ORAL
  Filled 2011-12-30: qty 2

## 2011-12-30 NOTE — ED Notes (Signed)
Pt c/o of non radiating right sided ribcage pain under right breast that is exacerbated by movement and relived by nothing. Pain began approximately 8pm. Pt denies N/V, and diaphoresis. Pt self administered 81 mg of ASA prior to arrival.

## 2011-12-30 NOTE — ED Provider Notes (Signed)
History     CSN: [id number]  Arrival date & time 12/30/11  2040   First MD Initiated Contact with Patient 12/30/11 2256      Chief Complaint  Patient presents with  . Chest Pain    (Consider location/radiation/quality/duration/timing/severity/associated sxs/prior treatment) HPI Comments: Patient presents with right-sided lower chest wall pain started around 6 PM. His been constant. She denies any trauma. Worse with movement and deep breathing. She denies any cough or fever. She gets relief with certain positions. Certain positions make her worse. Denies nausea, vomiting, abdominal pain or back pain. She denies any coronary disease history. She has a history of diastolic CHF, A. fib, hypertension.  The history is provided by the patient.    Past Medical History  Diagnosis Date  . HYPERTENSION   . Congestive heart failure, unspecified   . BRADYCARDIA   . ATRIAL FIBRILLATION, PAROXYSMAL   . OSTEOARTHRITIS   . HYPOTHYROIDISM   . GERD   . COPD     Past Surgical History  Procedure Date  . Thyroidectomy     History reviewed. No pertinent family history.  History  Substance Use Topics  . Smoking status: Never Smoker   . Smokeless tobacco: Not on file  . Alcohol Use: No    OB History    Grav Para Term Preterm Abortions TAB SAB Ect Mult Living                  Review of Systems  Constitutional: Negative for activity change and appetite change.  HENT: Negative for congestion and rhinorrhea.   Respiratory: Positive for chest tightness. Negative for cough and shortness of breath.   Cardiovascular: Positive for chest pain.  Gastrointestinal: Negative for nausea, vomiting and abdominal pain.  Genitourinary: Negative for dysuria, hematuria, vaginal bleeding and vaginal discharge.  Musculoskeletal: Negative for back pain.  Skin: Negative for rash.  Neurological: Negative for dizziness, tremors and headaches.    Allergies  Penicillins  Home Medications   Current  Outpatient Rx  Name Route Sig Dispense Refill  . AMLODIPINE BESYLATE 5 MG PO TABS Oral Take 5 mg by mouth daily.      . ASPIRIN EC 81 MG PO TBEC Oral Take 81 mg by mouth daily.    Marland Kitchen BRIMONIDINE TARTRATE 0.1 % OP SOLN Both Eyes Place 1 drop into both eyes every 8 (eight) hours.     Marland Kitchen CALTRATE 600 PO Oral Take 1 tablet by mouth daily.     . FUROSEMIDE 20 MG PO TABS Oral Take 20 mg by mouth 2 (two) times daily.      Marland Kitchen GLUCOSAMINE PO Oral Take 1 tablet by mouth daily.    Marland Kitchen LEVOTHYROXINE SODIUM 88 MCG PO TABS Oral Take 88 mcg by mouth daily.      Marland Kitchen MONTELUKAST SODIUM 10 MG PO TABS Oral Take 10 mg by mouth at bedtime.      . OMEPRAZOLE 20 MG PO CPDR Oral Take 20 mg by mouth daily.      . TRAVOPROST (BAK FREE) 0.004 % OP SOLN Both Eyes Place 1 drop into both eyes at bedtime.      Marland Kitchen VALSARTAN 160 MG PO TABS Oral Take 80 mg by mouth daily.       BP 143/84  Pulse 50  Temp 98.3 F (36.8 C) (Oral)  Resp 13  SpO2 100%  Physical Exam  Constitutional: She is oriented to person, place, and time. She appears well-developed and well-nourished. No distress.  HENT:  Head: Normocephalic and atraumatic.  Mouth/Throat: Oropharynx is clear and moist. No oropharyngeal exudate.  Eyes: Conjunctivae normal and EOM are normal. Pupils are equal, round, and reactive to light.  Neck: Normal range of motion. Neck supple.  Cardiovascular: Normal rate, regular rhythm and normal heart sounds.   No murmur heard. Pulmonary/Chest: Effort normal and breath sounds normal. No respiratory distress. She exhibits tenderness.       TTP R lower chest wall. No ecchymosis, no crepitance, no rash  Abdominal: Soft. There is no tenderness. There is no rebound and no guarding.  Musculoskeletal: Normal range of motion. She exhibits no edema and no tenderness.  Neurological: She is alert and oriented to person, place, and time. No cranial nerve deficit.  Skin: Skin is warm.    ED Course  Procedures (including critical care  time)  Labs Reviewed  BASIC METABOLIC PANEL - Abnormal; Notable for the following:    Creatinine, Ser 1.16 (*)     GFR calc non Af Amer 49 (*)     GFR calc Af Amer 57 (*)     All other components within normal limits  CBC  POCT I-STAT TROPONIN I  D-DIMER, QUANTITATIVE  TROPONIN I   Dg Chest 2 View  12/30/2011  *RADIOLOGY REPORT*  Clinical Data: Chest pain.  CHEST - 2 VIEW  Comparison: 10/02/2005  Findings: Two views of the chest were obtained.  Stable appearance of the heart and mediastinum.  Surgical clips in the lower neck. Lungs are clear without airspace disease or edema.  No evidence for pleural effusions.  Bony thorax is intact.  IMPRESSION: No acute chest findings.   Original Report Authenticated By: Richarda Overlie, M.D.      No diagnosis found.    MDM  Right-sided chest wall pain that is reproducible to palpation. EKG nonischemic: Negative, chest x-ray clear. Concern for possible herpes zoster given lack of trauma and location but no rash visible.  Asymptomatic bradycardia similar to previous. Patient's pain is reproducible on palpation of right chest wall. Atypical for ACS or PE. No abdominal pain. EKG nonischemic, troponin negative. Chest x-ray negative. D-dimer negative.  Patient counseled on observation for possible rash development as this may represent early zoster. Followup with PCP next week.    Date: 12/30/2011  Rate: 50  Rhythm: sinus bradycardia  QRS Axis: normal  Intervals: normal  ST/T Wave abnormalities: normal  Conduction Disutrbances:none  Narrative Interpretation:   Old EKG Reviewed: unchanged    Glynn Octave, MD 12/31/11 0050

## 2011-12-30 NOTE — ED Notes (Signed)
Pt reports pain under (R) breast starting an hour ago, pt reports increase pain w/palpating. Pt denies SOB, N/V, pain w/breathing, dizziness, or diaphoresis. Pt denies injury to area

## 2011-12-31 MED ORDER — IBUPROFEN 800 MG PO TABS: 800.0000 mg | ORAL_TABLET | Freq: Three times a day (TID) | ORAL | Status: DC

## 2011-12-31 MED ORDER — HYDROCODONE-ACETAMINOPHEN 5-325 MG PO TABS: 2.0000 | ORAL_TABLET | ORAL | Status: DC | PRN

## 2012-04-15 ENCOUNTER — Telehealth: Payer: Self-pay | Admitting: Cardiology

## 2012-04-15 NOTE — Telephone Encounter (Signed)
New Problem: ° ° ° °I called the patient and was unable to reach them. I left a message on their voicemail with my name, the reason I called, the name of their physician, and a number to call back to schedule their appointment. ° °

## 2012-05-31 ENCOUNTER — Encounter: Payer: Self-pay | Admitting: Cardiology

## 2012-05-31 ENCOUNTER — Ambulatory Visit (INDEPENDENT_AMBULATORY_CARE_PROVIDER_SITE_OTHER): Payer: Medicare Other | Admitting: Cardiology

## 2012-05-31 VITALS — BP 101/70 | HR 60 | Ht 63.0 in | Wt 143.8 lb

## 2012-05-31 DIAGNOSIS — I4891 Unspecified atrial fibrillation: Secondary | ICD-10-CM

## 2012-05-31 DIAGNOSIS — I509 Heart failure, unspecified: Secondary | ICD-10-CM

## 2012-05-31 DIAGNOSIS — I10 Essential (primary) hypertension: Secondary | ICD-10-CM

## 2012-05-31 DIAGNOSIS — I498 Other specified cardiac arrhythmias: Secondary | ICD-10-CM

## 2012-05-31 NOTE — Patient Instructions (Addendum)
Your physician has recommended you make the following change in your medication: stop taking Amlodipine  Your physician wants you to follow-up in: 1 year. You will receive a reminder letter in the mail two months in advance. If you don't receive a letter, please call our office to schedule the follow-up appointment.

## 2012-05-31 NOTE — Assessment & Plan Note (Signed)
Patient with some orthostatic symptoms. Discontinue Norvasc.

## 2012-05-31 NOTE — Progress Notes (Signed)
HPI: [name] [name] is a very pleasant female who has a history of congestive heart failure, felt secondary to diastolic dysfunction and paroxysmal atrial fibrillation in the setting of hyperthyroidism and mild bradycardia. Note, she had her last echocardiogram performed on February 04, 2006, that showed normal LV function and trivial mitral regurgitation and tricuspid regurgitation. She had a Myoview performed in Oct 2011 that showed soft tissue attenuation but no ischemia and an ejection fraction of 77%. I last saw her in Nov 2012. Since then the patient has dyspnea with more extreme activities but not with routine activities. It is relieved with rest. It is not associated with chest pain. There is no orthopnea, PND or pedal edema. There is no syncope or palpitations. There is no exertional chest pain. She occasionally has some lightheadedness with standing.  Current Outpatient Prescriptions  Medication Sig Dispense Refill  . acetaminophen (TYLENOL) 500 MG tablet Take 500 mg by mouth every 6 (six) hours as needed for pain.      Marland Kitchen alendronate (FOSAMAX) 70 MG tablet Take 70 mg by mouth every 7 (seven) days. Take with a full glass of water on an empty stomach.      Marland Kitchen amLODipine (NORVASC) 5 MG tablet Take 5 mg by mouth daily.        Marland Kitchen aspirin EC 81 MG tablet Take 81 mg by mouth daily.      . brimonidine (ALPHAGAN P) 0.1 % SOLN Place 1 drop into both eyes every 8 (eight) hours.       . Calcium Carbonate (CALTRATE 600 PO) Take 1 tablet by mouth daily.       . furosemide (LASIX) 20 MG tablet Take 20 mg by mouth 2 (two) times daily.        Marland Kitchen levothyroxine (SYNTHROID, LEVOTHROID) 88 MCG tablet Take 88 mcg by mouth daily.        . montelukast (SINGULAIR) 10 MG tablet Take 10 mg by mouth at bedtime.        Marland Kitchen oxybutynin (DITROPAN-XL) 5 MG 24 hr tablet Take 5 mg by mouth daily.      . Travoprost, BAK Free, (TRAVATAMN) 0.004 % SOLN ophthalmic solution Place 1 drop into both eyes at bedtime.        . valsartan  (DIOVAN) 160 MG tablet Take 80 mg by mouth daily.       Marland Kitchen GLUCOSAMINE PO Take 1 tablet by mouth daily.      Marland Kitchen HYDROcodone-acetaminophen (NORCO/VICODIN) 5-325 MG per tablet Take 2 tablets by mouth every 4 (four) hours as needed for pain.  10 tablet  0  . ibuprofen (ADVIL,MOTRIN) 800 MG tablet Take 1 tablet (800 mg total) by mouth 3 (three) times daily.  21 tablet  0  . omeprazole (PRILOSEC) 20 MG capsule Take 20 mg by mouth daily.         No current facility-administered medications for this visit.     Past Medical History  Diagnosis Date  . HYPERTENSION   . Congestive heart failure, unspecified   . BRADYCARDIA   . ATRIAL FIBRILLATION, PAROXYSMAL   . OSTEOARTHRITIS   . HYPOTHYROIDISM   . GERD   . COPD     Past Surgical History  Procedure Laterality Date  . Thyroidectomy      History   Social History  . Marital Status: Single    Spouse Name: N/A    Number of Children: N/A  . Years of Education: N/A   Occupational History  . Not on  file.   Social History Main Topics  . Smoking status: Never Smoker   . Smokeless tobacco: Not on file  . Alcohol Use: No  . Drug Use: No  . Sexually Active: Not on file   Other Topics Concern  . Not on file   Social History Narrative  . No narrative on file    ROS: no fevers or chills, productive cough, hemoptysis, dysphasia, odynophagia, melena, hematochezia, dysuria, hematuria, rash, seizure activity, orthopnea, PND, pedal edema, claudication. Remaining systems are negative.  Physical Exam: Well-developed well-nourished in no acute distress.  Skin is warm and dry.  HEENT is normal.  Neck is supple.  Chest is clear to auscultation with normal expansion.  Cardiovascular exam is regular but bradycardic Abdominal exam nontender or distended. No masses palpated. Extremities show no edema. neuro grossly intact  ECG marked sinus bradycardia at a rate of 45. No significant ST changes.

## 2012-05-31 NOTE — Assessment & Plan Note (Signed)
Euvolemic on examination. Continue present dose of Lasix. Potassium and renal function monitored by primary care. 

## 2012-05-31 NOTE — Assessment & Plan Note (Signed)
Asymptomatic. No further evaluation.

## 2012-05-31 NOTE — Assessment & Plan Note (Signed)
Occurred in the setting of hyperthyroidism which is now treated.

## 2012-07-04 ENCOUNTER — Other Ambulatory Visit: Payer: Self-pay

## 2012-07-04 DIAGNOSIS — Z1231 Encounter for screening mammogram for malignant neoplasm of breast: Secondary | ICD-10-CM

## 2012-08-15 ENCOUNTER — Ambulatory Visit
Admission: RE | Admit: 2012-08-15 | Discharge: 2012-08-15 | Disposition: A | Discharge status: Home / Self Care | Payer: Medicare Other | Source: Ambulatory Visit

## 2012-08-15 DIAGNOSIS — Z1231 Encounter for screening mammogram for malignant neoplasm of breast: Secondary | ICD-10-CM

## 2012-08-16 ENCOUNTER — Other Ambulatory Visit: Payer: Self-pay | Admitting: Internal Medicine

## 2012-08-16 DIAGNOSIS — R928 Other abnormal and inconclusive findings on diagnostic imaging of breast: Secondary | ICD-10-CM

## 2012-08-30 ENCOUNTER — Ambulatory Visit
Admission: RE | Admit: 2012-08-30 | Discharge: 2012-08-30 | Disposition: A | Discharge status: Home / Self Care | Payer: Medicare Other | Source: Ambulatory Visit | Attending: Internal Medicine | Admitting: Internal Medicine

## 2012-08-30 DIAGNOSIS — R928 Other abnormal and inconclusive findings on diagnostic imaging of breast: Secondary | ICD-10-CM

## 2012-11-23 ENCOUNTER — Other Ambulatory Visit: Payer: Self-pay | Admitting: *Deleted

## 2012-11-23 ENCOUNTER — Encounter (INDEPENDENT_AMBULATORY_CARE_PROVIDER_SITE_OTHER): Payer: Medicare Other

## 2012-11-23 ENCOUNTER — Encounter: Payer: Self-pay | Admitting: *Deleted

## 2012-11-23 DIAGNOSIS — R42 Dizziness and giddiness: Secondary | ICD-10-CM

## 2012-11-23 DIAGNOSIS — R001 Bradycardia, unspecified: Secondary | ICD-10-CM

## 2012-11-23 DIAGNOSIS — I495 Sick sinus syndrome: Secondary | ICD-10-CM

## 2012-11-23 NOTE — Progress Notes (Signed)
Patient ID: [name] [name], female   DOB: Apr 29, 1950, 62 y.o.   MRN: [id number] E-Cardio 24 Hour Holter Monitor applied to patient.

## 2012-11-29 ENCOUNTER — Telehealth: Payer: Self-pay

## 2012-11-29 NOTE — Telephone Encounter (Signed)
I called to give pt her holter monitor results. No answer, left message for pt to call office.

## 2012-12-20 ENCOUNTER — Telehealth: Payer: Self-pay | Admitting: *Deleted

## 2012-12-20 NOTE — Telephone Encounter (Signed)
Left message for pt to call, monitor reviewed by dr Jens Som shows sinus to sinus brady with pac's and pvc's.

## 2013-01-04 ENCOUNTER — Encounter: Payer: Self-pay | Admitting: *Deleted

## 2013-01-04 NOTE — Telephone Encounter (Signed)
Unable to reach pt by phone. Letter of results sent to pt

## 2013-01-24 ENCOUNTER — Other Ambulatory Visit: Payer: Self-pay | Admitting: Internal Medicine

## 2013-01-24 DIAGNOSIS — N63 Unspecified lump in unspecified breast: Secondary | ICD-10-CM

## 2013-02-10 ENCOUNTER — Other Ambulatory Visit: Payer: Self-pay | Admitting: Internal Medicine

## 2013-02-10 ENCOUNTER — Ambulatory Visit
Admission: RE | Admit: 2013-02-10 | Discharge: 2013-02-10 | Disposition: A | Discharge status: Home / Self Care | Payer: Commercial Managed Care - HMO | Source: Ambulatory Visit | Attending: Internal Medicine | Admitting: Internal Medicine

## 2013-02-10 DIAGNOSIS — N63 Unspecified lump in unspecified breast: Secondary | ICD-10-CM

## 2013-06-01 ENCOUNTER — Ambulatory Visit: Payer: Medicare Other | Admitting: Cardiology

## 2013-07-06 ENCOUNTER — Ambulatory Visit: Payer: Medicare Other | Admitting: Cardiology

## 2013-07-19 ENCOUNTER — Other Ambulatory Visit: Payer: Self-pay | Admitting: Internal Medicine

## 2013-07-19 DIAGNOSIS — N63 Unspecified lump in unspecified breast: Secondary | ICD-10-CM

## 2013-07-27 ENCOUNTER — Ambulatory Visit: Payer: Medicare Other | Admitting: Cardiology

## 2013-08-01 ENCOUNTER — Ambulatory Visit (INDEPENDENT_AMBULATORY_CARE_PROVIDER_SITE_OTHER): Payer: Commercial Managed Care - HMO | Admitting: Cardiology

## 2013-08-01 ENCOUNTER — Encounter: Payer: Self-pay | Admitting: Cardiology

## 2013-08-01 VITALS — BP 132/90 | HR 59 | Ht 63.0 in | Wt 135.0 lb

## 2013-08-01 DIAGNOSIS — I509 Heart failure, unspecified: Secondary | ICD-10-CM

## 2013-08-01 DIAGNOSIS — I10 Essential (primary) hypertension: Secondary | ICD-10-CM

## 2013-08-01 DIAGNOSIS — I4891 Unspecified atrial fibrillation: Secondary | ICD-10-CM

## 2013-08-01 NOTE — Assessment & Plan Note (Signed)
Continue present medications. I have asked her to follow her blood pressure at home. If it runs high we will increase her medications.

## 2013-08-01 NOTE — Assessment & Plan Note (Signed)
Occurred in the setting of hyperthyroidism which is now treated. No recurrences.

## 2013-08-01 NOTE — Assessment & Plan Note (Signed)
Continue present dose of Lasix. Euvolemic on examination. Check potassium and renal function.

## 2013-08-01 NOTE — Progress Notes (Signed)
HPI: [name] [name] is a very pleasant female who has a history of congestive heart failure, felt secondary to diastolic dysfunction and paroxysmal atrial fibrillation in the setting of hyperthyroidism and mild bradycardia. Note, she had her last echocardiogram performed on February 04, 2006, that showed normal LV function and trivial mitral regurgitation and tricuspid regurgitation. She had a Myoview performed in Oct 2011 that showed soft tissue attenuation but no ischemia and an ejection fraction of 77%. I last saw her in March 2014. Since then There is no dyspnea or chest pain. Occasional dizziness. No syncope.   Current Outpatient Prescriptions  Medication Sig Dispense Refill  . acetaminophen (TYLENOL) 500 MG tablet Take 500 mg by mouth every 6 (six) hours as needed for pain.      Marland Kitchen alendronate (FOSAMAX) 70 MG tablet Take 70 mg by mouth every 7 (seven) days. Take with a full glass of water on an empty stomach.      Marland Kitchen aspirin EC 81 MG tablet Take 81 mg by mouth daily.      . brimonidine (ALPHAGAN P) 0.1 % SOLN Place 1 drop into both eyes every 8 (eight) hours.       . Calcium Carbonate (CALTRATE 600 PO) Take 1 tablet by mouth daily.       . furosemide (LASIX) 20 MG tablet Take 20 mg by mouth 2 (two) times daily.        Marland Kitchen levothyroxine (SYNTHROID, LEVOTHROID) 88 MCG tablet Take 88 mcg by mouth daily.        . montelukast (SINGULAIR) 10 MG tablet Take 10 mg by mouth at bedtime.        Marland Kitchen omeprazole (PRILOSEC) 20 MG capsule Take 20 mg by mouth daily.        Marland Kitchen oxybutynin (DITROPAN-XL) 5 MG 24 hr tablet Take 5 mg by mouth daily.      . Travoprost, BAK Free, (TRAVATAMN) 0.004 % SOLN ophthalmic solution Place 1 drop into both eyes at bedtime.        . valsartan (DIOVAN) 160 MG tablet Take 80 mg by mouth daily.        No current facility-administered medications for this visit.     Past Medical History  Diagnosis Date  . HYPERTENSION   . Congestive heart failure, unspecified   . BRADYCARDIA    . ATRIAL FIBRILLATION, PAROXYSMAL   . OSTEOARTHRITIS   . HYPOTHYROIDISM   . GERD   . COPD     Past Surgical History  Procedure Laterality Date  . Thyroidectomy      History   Social History  . Marital Status: Single    Spouse Name: N/A    Number of Children: N/A  . Years of Education: N/A   Occupational History  . Not on file.   Social History Main Topics  . Smoking status: Never Smoker   . Smokeless tobacco: Not on file  . Alcohol Use: No  . Drug Use: No  . Sexual Activity: Not on file   Other Topics Concern  . Not on file   Social History Narrative  . No narrative on file    ROS: no fevers or chills, productive cough, hemoptysis, dysphasia, odynophagia, melena, hematochezia, dysuria, hematuria, rash, seizure activity, orthopnea, PND, pedal edema, claudication. Remaining systems are negative.  Physical Exam: Well-developed well-nourished in no acute distress.  Skin is warm and dry.  HEENT is normal.  Neck is supple.  Chest is clear to auscultation with normal expansion.  Cardiovascular exam is regular rate and rhythm.  Abdominal exam nontender or distended. No masses palpated. Extremities show no edema. neuro grossly intact  ECG Sinus bradycardia at a rate of 55. Occasional PVC. Nonspecific ST changes.

## 2013-08-01 NOTE — Patient Instructions (Signed)
Your physician wants you to follow-up in: ONE YEAR WITH DR CRENSHAW You will receive a reminder letter in the mail two months in advance. If you don't receive a letter, please call our office to schedule the follow-up appointment.   Your physician recommends that you HAVE LAB WORK TODAY 

## 2013-08-02 LAB — BASIC METABOLIC PANEL
BUN: 22 mg/dL (ref 6–23)
CHLORIDE: 104 meq/L (ref 96–112)
CO2: 30 meq/L (ref 19–32)
CREATININE: 1.2 mg/dL (ref 0.4–1.2)
Calcium: 9.9 mg/dL (ref 8.4–10.5)
GFR: 58.75 mL/min — ABNORMAL LOW (ref >60.00)
Glucose, Bld: 82 mg/dL (ref 70–99)
Potassium: 3.8 mEq/L (ref 3.5–5.1)
SODIUM: 140 meq/L (ref 135–145)

## 2013-08-16 ENCOUNTER — Ambulatory Visit
Admission: RE | Admit: 2013-08-16 | Discharge: 2013-08-16 | Disposition: A | Discharge status: Home / Self Care | Payer: Commercial Managed Care - HMO | Source: Ambulatory Visit | Attending: Internal Medicine | Admitting: Internal Medicine

## 2013-08-16 DIAGNOSIS — N63 Unspecified lump in unspecified breast: Secondary | ICD-10-CM

## 2014-05-15 ENCOUNTER — Telehealth: Payer: Self-pay | Admitting: Cardiology

## 2014-05-15 NOTE — Telephone Encounter (Signed)
I researched the chart.  It appears that [name] [name] saw [name] in 12/2004 with the diagnosis of CHF.  Patient verbalized understanding.

## 2014-05-15 NOTE — Telephone Encounter (Signed)
Pt called in wanting to know what year she had been diagnosed with CHF, her insurance company would like to know. Please call  Thanks

## 2014-07-30 ENCOUNTER — Other Ambulatory Visit: Payer: Self-pay

## 2014-07-30 DIAGNOSIS — Z1231 Encounter for screening mammogram for malignant neoplasm of breast: Secondary | ICD-10-CM

## 2014-08-21 ENCOUNTER — Other Ambulatory Visit: Payer: Self-pay

## 2014-08-21 ENCOUNTER — Other Ambulatory Visit: Payer: Self-pay | Admitting: Internal Medicine

## 2014-08-21 DIAGNOSIS — N63 Unspecified lump in unspecified breast: Secondary | ICD-10-CM

## 2014-08-22 ENCOUNTER — Ambulatory Visit: Payer: Commercial Managed Care - HMO

## 2014-08-27 ENCOUNTER — Ambulatory Visit
Admission: RE | Admit: 2014-08-27 | Discharge: 2014-08-27 | Disposition: A | Discharge status: Home / Self Care | Payer: Medicare Other | Source: Ambulatory Visit | Attending: Internal Medicine | Admitting: Internal Medicine

## 2014-08-27 DIAGNOSIS — N63 Unspecified lump in unspecified breast: Secondary | ICD-10-CM

## 2014-11-13 NOTE — Progress Notes (Signed)
HPI: FU history of congestive heart failure felt secondary to diastolic dysfunction, paroxysmal atrial fibrillation in the setting of hyperthyroidism and mild bradycardia. Note, she had her last echocardiogram performed on February 04, 2006, that showed normal LV function and trivial mitral regurgitation and tricuspid regurgitation. She had a Myoview performed in Oct 2011 that showed soft tissue attenuation but no ischemia and an ejection fraction of 77%. Since I last saw her, she denies dyspnea, chest pain, palpitations, syncope. No fatigue.  Current Outpatient Prescriptions  Medication Sig Dispense Refill  . acetaminophen (TYLENOL) 500 MG tablet Take 500 mg by mouth every 6 (six) hours as needed for pain.    Marland Kitchen alendronate (FOSAMAX) 70 MG tablet Take 70 mg by mouth every 7 (seven) days. Take with a full glass of water on an empty stomach.    Marland Kitchen aspirin EC 81 MG tablet Take 81 mg by mouth daily.    . brimonidine (ALPHAGAN P) 0.1 % SOLN Place 1 drop into both eyes every 8 (eight) hours.     . Calcium Carbonate (CALTRATE 600 PO) Take 1 tablet by mouth daily.     . fluticasone (VERAMYST) 27.5 MCG/SPRAY nasal spray Place 2 sprays into the nose daily.    . furosemide (LASIX) 20 MG tablet Take 20 mg by mouth 2 (two) times daily.      Marland Kitchen levothyroxine (SYNTHROID, LEVOTHROID) 88 MCG tablet Take 88 mcg by mouth daily.      . montelukast (SINGULAIR) 10 MG tablet Take 10 mg by mouth at bedtime.      Marland Kitchen oxybutynin (DITROPAN-XL) 5 MG 24 hr tablet Take 5 mg by mouth daily.    . sodium chloride (OCEAN) 0.65 % SOLN nasal spray Place 1 spray into both nostrils as needed for congestion.    . Travoprost, BAK Free, (TRAVATAMN) 0.004 % SOLN ophthalmic solution Place 1 drop into both eyes at bedtime.      . valsartan (DIOVAN) 160 MG tablet Take 80 mg by mouth daily.      No current facility-administered medications for this visit.     Past Medical History  Diagnosis Date  . HYPERTENSION   . Congestive  heart failure, unspecified   . BRADYCARDIA   . ATRIAL FIBRILLATION, PAROXYSMAL   . OSTEOARTHRITIS   . HYPOTHYROIDISM   . GERD   . COPD     Past Surgical History  Procedure Laterality Date  . Thyroidectomy      Social History   Social History  . Marital Status: Single    Spouse Name: N/A  . Number of Children: N/A  . Years of Education: N/A   Occupational History  . Not on file.   Social History Main Topics  . Smoking status: Never Smoker   . Smokeless tobacco: Not on file  . Alcohol Use: No  . Drug Use: No  . Sexual Activity: Not on file   Other Topics Concern  . Not on file   Social History Narrative    ROS: no fevers or chills, productive cough, hemoptysis, dysphasia, odynophagia, melena, hematochezia, dysuria, hematuria, rash, seizure activity, orthopnea, PND, pedal edema, claudication. Remaining systems are negative.  Physical Exam: Well-developed well-nourished in no acute distress.  Skin is warm and dry.  HEENT is normal.  Neck is supple.  Chest is clear to auscultation with normal expansion.  Cardiovascular exam is regular but bradycardic Abdominal exam nontender or distended. No masses palpated. Extremities show no edema. neuro grossly intact  ECG marked  sinus bradycardia at a rate of 38. Normal axis. Nonspecific ST changes.

## 2014-11-16 ENCOUNTER — Ambulatory Visit (INDEPENDENT_AMBULATORY_CARE_PROVIDER_SITE_OTHER): Payer: Medicare Other | Admitting: Cardiology

## 2014-11-16 ENCOUNTER — Encounter: Payer: Self-pay | Admitting: Cardiology

## 2014-11-16 VITALS — BP 106/76 | HR 38 | Ht 63.0 in | Wt 134.4 lb

## 2014-11-16 DIAGNOSIS — I5032 Chronic diastolic (congestive) heart failure: Secondary | ICD-10-CM | POA: Diagnosis not present

## 2014-11-16 DIAGNOSIS — R001 Bradycardia, unspecified: Secondary | ICD-10-CM

## 2014-11-16 DIAGNOSIS — I480 Paroxysmal atrial fibrillation: Secondary | ICD-10-CM

## 2014-11-16 DIAGNOSIS — I10 Essential (primary) hypertension: Secondary | ICD-10-CM

## 2014-11-16 NOTE — Assessment & Plan Note (Signed)
Blood pressure is controlled. Continue present medications. Potassium and renal function monitored by primary care.

## 2014-11-16 NOTE — Assessment & Plan Note (Signed)
Patient continues to have significant sinus bradycardia. However this has been long-standing and she is not having symptoms. No further intervention indicated. I have asked her to be aware of and report episodes of presyncope or syncope.

## 2014-11-16 NOTE — Patient Instructions (Signed)
Dr Stanford Breed recommends that you schedule a follow-up appointment in 1 year. You will receive a reminder letter in the mail two months in advance. If you don't receive a letter, please call our office to schedule the follow-up appointment.

## 2014-11-16 NOTE — Assessment & Plan Note (Signed)
Patient had atrial fibrillation previouslyin the setting of hyperthyroidism. This was treated and she's had no recurrences.

## 2014-11-16 NOTE — Assessment & Plan Note (Signed)
Patient with history of chronic diastolic congestive heart failure. She is euvolemic on examination. Continue present dose of Lasix.

## 2015-07-22 ENCOUNTER — Other Ambulatory Visit: Payer: Self-pay

## 2015-07-22 DIAGNOSIS — Z1231 Encounter for screening mammogram for malignant neoplasm of breast: Secondary | ICD-10-CM

## 2015-08-28 ENCOUNTER — Ambulatory Visit
Admission: RE | Admit: 2015-08-28 | Discharge: 2015-08-28 | Disposition: A | Discharge status: Home / Self Care | Payer: Medicare Other | Source: Ambulatory Visit

## 2015-08-28 DIAGNOSIS — Z1231 Encounter for screening mammogram for malignant neoplasm of breast: Secondary | ICD-10-CM

## 2015-11-05 ENCOUNTER — Encounter: Payer: Self-pay | Admitting: Cardiology

## 2015-11-21 ENCOUNTER — Ambulatory Visit: Payer: Medicare Other | Admitting: Cardiology

## 2016-07-27 ENCOUNTER — Other Ambulatory Visit: Payer: Self-pay | Admitting: Internal Medicine

## 2016-07-27 DIAGNOSIS — Z1231 Encounter for screening mammogram for malignant neoplasm of breast: Secondary | ICD-10-CM

## 2016-08-28 ENCOUNTER — Ambulatory Visit
Admission: RE | Admit: 2016-08-28 | Discharge: 2016-08-28 | Disposition: A | Discharge status: Home / Self Care | Payer: Medicare Other | Source: Ambulatory Visit | Attending: Internal Medicine | Admitting: Internal Medicine

## 2016-08-28 DIAGNOSIS — Z1231 Encounter for screening mammogram for malignant neoplasm of breast: Secondary | ICD-10-CM

## 2016-11-13 NOTE — Progress Notes (Signed)
HPI: FU history of congestive heart failure felt secondary to diastolic dysfunction, paroxysmal atrial fibrillation in the setting of hyperthyroidism and mild bradycardia. Note, she had her last echocardiogram performed on February 04, 2006, that showed normal LV function and trivial mitral regurgitation and tricuspid regurgitation. She had a Myoview performed in Oct 2011 that showed soft tissue attenuation but no ischemia and an ejection fraction of 77%. Since I last saw her, the patient has dyspnea with more extreme activities but not with routine activities. It is relieved with rest. It is not associated with chest pain. There is no orthopnea, PND or pedal edema. There is no syncope or palpitations. There is no exertional chest pain.   Current Outpatient Prescriptions  Medication Sig Dispense Refill  . acetaminophen (TYLENOL) 500 MG tablet Take 500 mg by mouth every 6 (six) hours as needed for pain.    Marland Kitchen alendronate (FOSAMAX) 70 MG tablet Take 70 mg by mouth every 7 (seven) days. Take with a full glass of water on an empty stomach.    Marland Kitchen aspirin EC 81 MG tablet Take 81 mg by mouth daily.    . brimonidine (ALPHAGAN P) 0.1 % SOLN Place 1 drop into both eyes every 8 (eight) hours.     . Calcium Carbonate (CALTRATE 600 PO) Take 1 tablet by mouth daily.     . fluticasone (VERAMYST) 27.5 MCG/SPRAY nasal spray Place 2 sprays into the nose daily.    . furosemide (LASIX) 20 MG tablet Take 20 mg by mouth 2 (two) times daily.      Marland Kitchen levothyroxine (SYNTHROID, LEVOTHROID) 88 MCG tablet Take 88 mcg by mouth daily.      Marland Kitchen losartan (COZAAR) 50 MG tablet Take 1 tablet by mouth daily.    . montelukast (SINGULAIR) 10 MG tablet Take 10 mg by mouth at bedtime.      Marland Kitchen oxybutynin (DITROPAN-XL) 5 MG 24 hr tablet Take 5 mg by mouth daily.    . sodium chloride (OCEAN) 0.65 % SOLN nasal spray Place 1 spray into both nostrils as needed for congestion.    . Travoprost, BAK Free, (TRAVATAMN) 0.004 % SOLN ophthalmic  solution Place 1 drop into both eyes at bedtime.       No current facility-administered medications for this visit.      Past Medical History:  Diagnosis Date  . ATRIAL FIBRILLATION, PAROXYSMAL   . BRADYCARDIA   . Congestive heart failure, unspecified   . COPD   . GERD   . HYPERTENSION   . HYPOTHYROIDISM   . OSTEOARTHRITIS     Past Surgical History:  Procedure Laterality Date  . THYROIDECTOMY      Social History   Social History  . Marital status: Single    Spouse name: N/A  . Number of children: N/A  . Years of education: N/A   Occupational History  . Not on file.   Social History Main Topics  . Smoking status: Never Smoker  . Smokeless tobacco: Never Used  . Alcohol use No  . Drug use: No  . Sexual activity: Not on file   Other Topics Concern  . Not on file   Social History Narrative  . No narrative on file    History reviewed. No pertinent family history.  ROS: no fevers or chills, productive cough, hemoptysis, dysphasia, odynophagia, melena, hematochezia, dysuria, hematuria, rash, seizure activity, orthopnea, PND, pedal edema, claudication. Remaining systems are negative.  Physical Exam: Well-developed well-nourished in no acute distress.  Skin is warm and dry.  HEENT is normal.  Neck is supple.  Chest is clear to auscultation with normal expansion.  Cardiovascular exam is regular and bradycardic Abdominal exam nontender or distended. No masses palpated. Extremities show no edema. neuro grossly intact  ECG- Sinus bradycardia at a rate of 50. Low voltage. personally reviewed  A/P  1 Chronic diastolic congestive heart failure-patient is doing well and appears to be euvolemic on examination. Continue present dose of Lasix. Patient instructed on importance of low sodium diet and fluid restriction. Potassium and renal function monitored by primary care.  2 bradycardia-patient has a long history of sinus bradycardia. However she is not symptomatic.  No further intervention indicated at this point.  3 paroxysmal atrial fibrillation-this occurred in the setting of hyperthyroidism. She has had no recurrences since her thyroid was treated.  4 hypertension-blood pressure is controlled. Continue present medications.  Kirk Ruths, MD

## 2016-11-19 ENCOUNTER — Encounter: Payer: Self-pay | Admitting: Cardiology

## 2016-11-19 ENCOUNTER — Ambulatory Visit (INDEPENDENT_AMBULATORY_CARE_PROVIDER_SITE_OTHER): Payer: Medicare Other | Admitting: Cardiology

## 2016-11-19 VITALS — BP 100/80 | HR 50 | Ht 63.0 in | Wt 143.0 lb

## 2016-11-19 DIAGNOSIS — I480 Paroxysmal atrial fibrillation: Secondary | ICD-10-CM

## 2016-11-19 DIAGNOSIS — I10 Essential (primary) hypertension: Secondary | ICD-10-CM

## 2016-11-19 DIAGNOSIS — I5032 Chronic diastolic (congestive) heart failure: Secondary | ICD-10-CM

## 2016-11-19 NOTE — Patient Instructions (Signed)
Your physician wants you to follow-up in: ONE YEAR WITH DR CRENSHAW You will receive a reminder letter in the mail two months in advance. If you don't receive a letter, please call our office to schedule the follow-up appointment.   If you need a refill on your cardiac medications before your next appointment, please call your pharmacy.  

## 2017-07-22 ENCOUNTER — Other Ambulatory Visit: Payer: Self-pay | Admitting: Internal Medicine

## 2017-07-22 DIAGNOSIS — Z1231 Encounter for screening mammogram for malignant neoplasm of breast: Secondary | ICD-10-CM

## 2017-08-27 ENCOUNTER — Ambulatory Visit: Payer: Medicare Other

## 2017-09-03 ENCOUNTER — Ambulatory Visit: Payer: Medicare Other

## 2017-09-03 ENCOUNTER — Ambulatory Visit
Admission: RE | Admit: 2017-09-03 | Discharge: 2017-09-03 | Disposition: A | Discharge status: Home / Self Care | Payer: Medicare Other | Source: Ambulatory Visit | Attending: Internal Medicine | Admitting: Internal Medicine

## 2017-09-03 DIAGNOSIS — Z1231 Encounter for screening mammogram for malignant neoplasm of breast: Secondary | ICD-10-CM

## 2017-12-27 DIAGNOSIS — I5030 Unspecified diastolic (congestive) heart failure: Secondary | ICD-10-CM | POA: Diagnosis not present

## 2018-01-24 ENCOUNTER — Ambulatory Visit: Payer: Medicare Other | Attending: Orthopaedic Surgery | Admitting: Physical Therapy

## 2018-01-24 ENCOUNTER — Other Ambulatory Visit: Payer: Self-pay

## 2018-01-24 ENCOUNTER — Encounter: Payer: Self-pay | Admitting: Physical Therapy

## 2018-01-24 DIAGNOSIS — R262 Difficulty in walking, not elsewhere classified: Secondary | ICD-10-CM | POA: Insufficient documentation

## 2018-01-24 DIAGNOSIS — M25562 Pain in left knee: Secondary | ICD-10-CM | POA: Insufficient documentation

## 2018-01-24 DIAGNOSIS — G8929 Other chronic pain: Secondary | ICD-10-CM | POA: Diagnosis present

## 2018-01-24 DIAGNOSIS — M25561 Pain in right knee: Secondary | ICD-10-CM | POA: Insufficient documentation

## 2018-01-24 DIAGNOSIS — M6281 Muscle weakness (generalized): Secondary | ICD-10-CM | POA: Insufficient documentation

## 2018-01-24 NOTE — Therapy (Signed)
[place], [place], [address] Phone: [phone]   Fax:  [phone]  Physical Therapy Evaluation  Patient Details  Name: [name] [name] MRN: [id number] Date of Birth: 1950/04/07 Referring Provider (PT): Ophelia Charter, MD   Encounter Date: 01/24/2018  PT End of Session - 01/24/18 1021    Visit Number  1    Number of Visits  5    Date for PT Re-Evaluation  03/07/18    Authorization Type  [employer] MCR    PT Start Time  0926    PT Stop Time  1011    PT Time Calculation (min)  45 min    Activity Tolerance  Patient tolerated treatment well    Behavior During Therapy  [facility] for tasks assessed/performed       Past Medical History:  Diagnosis Date  . ATRIAL FIBRILLATION, PAROXYSMAL   . BRADYCARDIA   . Congestive heart failure, unspecified   . COPD   . GERD   . HYPERTENSION   . HYPOTHYROIDISM   . OSTEOARTHRITIS     Past Surgical History:  Procedure Laterality Date  . THYROIDECTOMY      There were no vitals filed for this visit.   Subjective Assessment - 01/24/18 1001    Subjective  Pt. reports 6 month history insidious onset bilateral knee pain. Pt. saw MD and had X-rays and reports diagnosed with knee arthritis. Pt. reports was offered injections but declined, wants to try PT and wishes to focus on HEP.    Limitations  Standing;Walking    How long can you sit comfortably?  no limitations    How long can you stand comfortably?  15-20 minutes    How long can you walk comfortably?  5-7 minutes    Diagnostic tests  X-rays    Patient Stated Goals  More strength in legs    Currently in Pain?  Yes    Pain Score  8     Pain Location  Knee    Pain Orientation  Right;Left    Pain Descriptors / Indicators  Sharp    Pain Type  Chronic pain    Pain Onset  More than a month ago    Pain Frequency  Intermittent    Aggravating Factors   walking, stair navigation    Pain Relieving Factors  rest    Effect of Pain  on Daily Activities  Limits walking tolerance and ability for stair navigation    Multiple Pain Sites  No         [facility] PT Assessment - 01/24/18 0001      Assessment   Medical Diagnosis  Bilateral Knee Pain    Referring Provider (PT)  Ophelia Charter, MD    Onset Date/Surgical Date  07/07/17    Hand Dominance  Right    Next MD Visit  01/31/2018    Prior Therapy  none      Precautions   Precautions  None      Restrictions   Weight Bearing Restrictions  No      Balance Screen   Has the patient fallen in the past 6 months  No      [place] residence    Type of Home  Apartment    Home Access  --   1 step in front, 2 steps out back   [place]  --   [place]  Level of Independence  Independent with basic ADLs   Mod I for community ambulation-uses SPC     Cognition   Overall Cognitive Status  Within Functional Limits for tasks assessed      Observation/Other Assessments   Focus on Therapeutic Outcomes (FOTO)   50% Limited      ROM / Strength   AROM / PROM / Strength  AROM;Strength      AROM   AROM Assessment Site  Hip;Knee    Right/Left Hip  --   Hip AROM/PROM grossly WFL   Right/Left Knee  Right;Left    Right Knee Extension  0    Right Knee Flexion  144    Left Knee Extension  1    Left Knee Flexion  145      Strength   Strength Assessment Site  Hip;Knee    Right/Left Hip  --   Bilat. hip strength grossly 4+/5   Right/Left Knee  Right;Left    Right Knee Flexion  5/5    Right Knee Extension  5/5    Left Knee Flexion  4/5    Left Knee Extension  4/5      Flexibility   Soft Tissue Assessment /Muscle Length  --   Mild hamstring tightness with SLR 70 deg bilat.     Palpation   Palpation comment  Some peripatellar creptius noted with patellar mobs      Ambulation/Gait   Ambulation/Gait  --   ambulates independently in clinic, reports community use SPC               Objective measurements  completed on examination: See above findings.      [place] Adult PT Treatment/Exercise - 01/24/18 0001      Exercises   Exercises  Knee/Hip      Knee/Hip Exercises: Stretches   Passive Hamstring Stretch  Both;2 reps;30 seconds      Knee/Hip Exercises: Standing   Hip Abduction  Both;1 set;10 reps      Knee/Hip Exercises: Supine   Quad Sets  Both;1 set;10 reps    Bridges  1 set;10 reps    Straight Leg Raises  Both;1 set;10 reps             PT Education - 01/24/18 1021    Education Details  Knee anatomy with etiology current symptoms, HEP, POC    Person(s) Educated  Patient    Methods  Explanation;Handout;Demonstration    Comprehension  Verbalized understanding          PT Long Term Goals - 01/24/18 1029      PT LONG TERM GOAL #1   Title  Independent with HEP    Baseline  no HEP    Time  6    Period  Weeks    Status  New    Target Date  03/07/18      PT LONG TERM GOAL #2   Title  Left knee strength 5/5 to improve ability for stair navigation and transfers from low chairs    Baseline  L knee 4/5, right knee 5/5    Time  6    Period  Weeks    Status  New    Target Date  03/07/18      PT LONG TERM GOAL #3   Title  Improve FOTO outcome measure score to 37% or less impairment    Baseline  50%    Time  6    Period  Weeks    Status  New    Target Date  03/07/18      PT LONG TERM GOAL #4   Title  Tolerate ambulation periods 15-20 min or greater to improve ability for activities such as grocery shopping    Baseline  reports limited 5-7 minutes    Time  6    Period  Weeks    Status  New    Target Date  03/07/18             Plan - 01/24/18 1022    Clinical Impression Statement  Pt. presents with bilateral knee pain with underlying OA with hip and knee muscle weakness, mild muscle tightness. Suspect patellofemoral etiology for more superficial aspect of symptoms. Pt. would benefit from PT to address current associated functional limitations for  mobility.    History and Personal Factors relevant to plan of care:  age and duration symptoms, planned limit formal therapy attendance/focus HEP per pt. request    Clinical Presentation  Stable    Clinical Decision Making  Low    Rehab Potential  Good    Clinical Impairments Affecting Rehab Potential  Minimal comorbidities, good potential for strength gains    PT Frequency  --   Begin HEP and follow up as needed for up to 4 follow up visits   PT Duration  6 weeks    PT Treatment/Interventions  ADLs/Self Care Home Management;Cryotherapy;Electrical Stimulation;Moist Heat;Therapeutic exercise;Balance training;Therapeutic activities;Functional mobility training;Stair training;Gait training;Neuromuscular re-education;Patient/family education;Manual techniques;Passive range of motion;Dry needling;Taping    PT Next Visit Plan  If returning recumbent bike warm up, review HEP and progress open and closed chain knee and hip strengthening as tolerated pending pain, add step-ups and partial squat at counter pending pain    PT Home Exercise Plan  quad sets, supine SLR, standing hip abd SLR, hip bridge, sit>stand, hamstring stretches    Consulted and Agree with Plan of Care  Patient       Patient will benefit from skilled therapeutic intervention in order to improve the following deficits and impairments:  Pain, Decreased strength, Difficulty walking, Decreased endurance, Decreased activity tolerance, Impaired flexibility  Visit Diagnosis: Chronic pain of right knee  Chronic pain of left knee  Muscle weakness (generalized)  Difficulty in walking, not elsewhere classified     Problem List Patient Active Problem List   Diagnosis Date Noted  . CHEST PAIN 12/10/2009  . BRADYCARDIA 10/04/2008  . HYPERTHYROIDISM 12/11/2006  . HYPOTHYROIDISM 08/16/2006  . Essential hypertension 08/16/2006  . ATRIAL FIBRILLATION, PAROXYSMAL 08/16/2006  . Congestive heart failure ([place]) 08/16/2006  . COPD 08/16/2006   . GERD 08/16/2006  . OSTEOARTHRITIS 08/16/2006  . HYSTERECTOMY, HX OF 08/16/2006  . THYROIDECTOMY, HX OF 08/16/2006    Beaulah Dinning, PT, DPT 01/24/18 10:34 AM  [facility] [address] [place], [place], [address] Phone: [phone]   Fax:  [phone]  Name: INGRIS PASQUARELLA MRN: [id number] Date of Birth: 08-20-1950

## 2018-04-01 NOTE — Therapy (Signed)
[place], [place], [address] Phone: [phone]   Fax:  [phone]  Physical Therapy Evaluation/Discharge  Patient Details  Name: [name] [name] MRN: [id number] Date of Birth: 1951-01-19 Referring Provider (PT): Ophelia Charter, MD   Encounter Date: 01/24/2018    Past Medical History:  Diagnosis Date  . ATRIAL FIBRILLATION, PAROXYSMAL   . BRADYCARDIA   . Congestive heart failure, unspecified   . COPD   . GERD   . HYPERTENSION   . HYPOTHYROIDISM   . OSTEOARTHRITIS     Past Surgical History:  Procedure Laterality Date  . THYROIDECTOMY      There were no vitals filed for this visit.                  Objective measurements completed on examination: See above findings.                   PT Long Term Goals - 01/24/18 1029      PT LONG TERM GOAL #1   Title  Independent with HEP    Baseline  no HEP    Time  6    Period  Weeks    Status  New    Target Date  03/07/18      PT LONG TERM GOAL #2   Title  Left knee strength 5/5 to improve ability for stair navigation and transfers from low chairs    Baseline  L knee 4/5, right knee 5/5    Time  6    Period  Weeks    Status  New    Target Date  03/07/18      PT LONG TERM GOAL #3   Title  Improve FOTO outcome measure score to 37% or less impairment    Baseline  50%    Time  6    Period  Weeks    Status  New    Target Date  03/07/18      PT LONG TERM GOAL #4   Title  Tolerate ambulation periods 15-20 min or greater to improve ability for activities such as grocery shopping    Baseline  reports limited 5-7 minutes    Time  6    Period  Weeks    Status  New    Target Date  03/07/18               Patient will benefit from skilled therapeutic intervention in order to improve the following deficits and impairments:  Pain, Decreased strength, Difficulty walking, Decreased endurance, Decreased activity  tolerance, Impaired flexibility  Visit Diagnosis: Chronic pain of right knee - Plan: PT plan of care cert/re-cert  Chronic pain of left knee - Plan: PT plan of care cert/re-cert  Muscle weakness (generalized) - Plan: PT plan of care cert/re-cert  Difficulty in walking, not elsewhere classified - Plan: PT plan of care cert/re-cert     Problem List Patient Active Problem List   Diagnosis Date Noted  . CHEST PAIN 12/10/2009  . BRADYCARDIA 10/04/2008  . HYPERTHYROIDISM 12/11/2006  . HYPOTHYROIDISM 08/16/2006  . Essential hypertension 08/16/2006  . ATRIAL FIBRILLATION, PAROXYSMAL 08/16/2006  . Congestive heart failure ([place]) 08/16/2006  . COPD 08/16/2006  . GERD 08/16/2006  . OSTEOARTHRITIS 08/16/2006  . HYSTERECTOMY, HX OF 08/16/2006  . THYROIDECTOMY, HX OF 08/16/2006       PHYSICAL THERAPY DISCHARGE SUMMARY  Visits from Start of Care: 1  Current functional level related to goals /  functional outcomes: Status unknown, patient did not return after initial evaluation for further therapy   Remaining deficits: Unknown   Education / Equipment: NA Plan:                                                    Patient goals were not met. Patient is being discharged due to not returning since the last visit.  ?????          Beaulah Dinning, PT, DPT 04/01/18 10:48 AM        [facility] [address] [place], [place], [address] Phone: [phone]   Fax:  [phone]  Name: [name] MRN: [id number] Date of Birth: 03-06-1951

## 2018-07-29 ENCOUNTER — Other Ambulatory Visit: Payer: Self-pay | Admitting: Internal Medicine

## 2018-07-29 DIAGNOSIS — Z1231 Encounter for screening mammogram for malignant neoplasm of breast: Secondary | ICD-10-CM

## 2018-09-19 ENCOUNTER — Ambulatory Visit
Admission: RE | Admit: 2018-09-19 | Discharge: 2018-09-19 | Disposition: A | Discharge status: Home / Self Care | Payer: Medicare Other | Source: Ambulatory Visit | Attending: Internal Medicine | Admitting: Internal Medicine

## 2018-09-19 DIAGNOSIS — Z1231 Encounter for screening mammogram for malignant neoplasm of breast: Secondary | ICD-10-CM

## 2018-10-14 ENCOUNTER — Other Ambulatory Visit: Payer: Self-pay | Admitting: Internal Medicine

## 2018-10-14 DIAGNOSIS — M810 Age-related osteoporosis without current pathological fracture: Secondary | ICD-10-CM

## 2018-10-19 NOTE — Progress Notes (Signed)
Virtual Visit via Video Note changed to phone visit at pt request   This visit type was conducted due to national recommendations for restrictions regarding the COVID-19 Pandemic (e.g. social distancing) in an effort to limit this patient's exposure and mitigate transmission in our community.  Due to her co-morbid illnesses, this patient is at least at moderate risk for complications without adequate follow up.  This format is felt to be most appropriate for this patient at this time.  All issues noted in this document were discussed and addressed.  A limited physical exam was performed with this format.  Please refer to the patient's chart for her consent to telehealth for [facility].   Date:  10/21/2018   ID:  [name] [name], DOB [birth year], MRN [id number]  Patient Location:Home Provider Location: Home  PCP:  Seward Carol, MD  Cardiologist:  Dr Stanford Breed  Evaluation Performed:  Follow-Up Visit  Chief Complaint:  FU atrial fibrillation  History of Present Illness:    FU history of congestive heart failure felt secondary to diastolic dysfunction, paroxysmal atrial fibrillation in the setting of hyperthyroidism and mild bradycardia. Note, she had her last echocardiogram performed on February 04, 2006, that showed normal LV function and trivial mitral regurgitation and tricuspid regurgitation. She had a Myoview performed in Oct 2011 that showed soft tissue attenuation but no ischemia and an ejection fraction of 77%. Since I last saw her, the patient denies any dyspnea on exertion, orthopnea, PND, pedal edema, palpitations, syncope or chest pain.   The patient does not have symptoms concerning for COVID-19 infection (fever, chills, cough, or new shortness of breath).    Past Medical History:  Diagnosis Date  . ATRIAL FIBRILLATION, PAROXYSMAL   . BRADYCARDIA   . Congestive heart failure, unspecified   . COPD   . GERD   . HYPERTENSION   . HYPOTHYROIDISM   . OSTEOARTHRITIS     Past Surgical History:  Procedure Laterality Date  . THYROIDECTOMY       Current Meds  Medication Sig  . acetaminophen (TYLENOL) 500 MG tablet Take 500 mg by mouth every 6 (six) hours as needed for pain.  Marland Kitchen alendronate (FOSAMAX) 70 MG tablet Take 70 mg by mouth every 7 (seven) days. Take with a full glass of water on an empty stomach.  Marland Kitchen aspirin EC 81 MG tablet Take 81 mg by mouth daily.  . brimonidine (ALPHAGAN P) 0.1 % SOLN Place 1 drop into both eyes every 8 (eight) hours.   . Calcium Carbonate (CALTRATE 600 PO) Take 1 tablet by mouth daily.   . fluticasone (VERAMYST) 27.5 MCG/SPRAY nasal spray Place 2 sprays into the nose daily.  . furosemide (LASIX) 20 MG tablet Take 20 mg by mouth 2 (two) times daily.    Marland Kitchen levothyroxine (SYNTHROID, LEVOTHROID) 88 MCG tablet Take 88 mcg by mouth daily.    . meloxicam (MOBIC) 15 MG tablet Take 15 mg by mouth daily.  . montelukast (SINGULAIR) 10 MG tablet Take 10 mg by mouth at bedtime.    Marland Kitchen oxybutynin (DITROPAN-XL) 5 MG 24 hr tablet Take 5 mg by mouth daily.  . sodium chloride (OCEAN) 0.65 % SOLN nasal spray Place 1 spray into both nostrils as needed for congestion.  . Travoprost, BAK Free, (TRAVATAMN) 0.004 % SOLN ophthalmic solution Place 1 drop into both eyes at bedtime.    . Vitamin D, Ergocalciferol, (DRISDOL) 1.25 MG (50000 UT) CAPS capsule Take 50,000 Units by mouth every 7 (seven) days.  Allergies:   Penicillins   Social History   Tobacco Use  . Smoking status: Never Smoker  . Smokeless tobacco: Never Used  Substance Use Topics  . Alcohol use: No  . Drug use: No     Family Hx: The patient's family history is not on file.  ROS:   Please see the history of present illness.    No Fever, chills  or productive cough All other systems reviewed and are negative.  Recent Lipid Panel Lab Results  Component Value Date/Time   CHOL 176 07/28/2007 08:48 PM   TRIG 120 07/28/2007 08:48 PM   HDL 62 07/28/2007 08:48 PM   CHOLHDL 2.8  Ratio 07/28/2007 08:48 PM   LDLCALC 90 07/28/2007 08:48 PM    Wt Readings from Last 3 Encounters:  11/19/16 143 lb (64.9 kg)  11/16/14 134 lb 6.4 oz (61 kg)  08/01/13 135 lb (61.2 kg)     Objective:    Vital Signs:  There were no vitals taken for this visit.   VITAL SIGNS:  reviewed NAD Answers questions appropriately Normal affect Remainder of physical examination not performed (telehealth visit; coronavirus pandemic)  ASSESSMENT & PLAN:    1. History of paroxysmal atrial fibrillation-this occurred in the setting of hyperthyroidism which was treated.  No recurrences. 2. Chronic diastolic congestive heart failure-patient appears to be doing well from a symptomatic standpoint.  Continue fluid restriction and low-sodium diet.  Continue present dose of diuretic.  Potassium and renal function followed by primary care. 3. Hypertension-Continue present medications and follow. 4. History of bradycardia-however no symptoms of presyncope or syncope.  No indication for further therapy.  COVID-19 Education: The importance of social distancing was discussed today.  Time:   Today, I have spent 16 minutes with the patient with telehealth technology discussing the above problems.     Medication Adjustments/Labs and Tests Ordered: Current medicines are reviewed at length with the patient today.  Concerns regarding medicines are outlined above.   Tests Ordered: No orders of the defined types were placed in this encounter.   Medication Changes: No orders of the defined types were placed in this encounter.   Follow Up:  Virtual Visit or In Person in 1 year(s)  Signed, Kirk Ruths, MD  10/21/2018 8:14 AM    [place]

## 2018-10-21 ENCOUNTER — Telehealth (INDEPENDENT_AMBULATORY_CARE_PROVIDER_SITE_OTHER): Payer: Medicare Other | Admitting: Cardiology

## 2018-10-21 ENCOUNTER — Other Ambulatory Visit: Payer: Self-pay

## 2018-10-21 DIAGNOSIS — I480 Paroxysmal atrial fibrillation: Secondary | ICD-10-CM

## 2018-10-21 DIAGNOSIS — I10 Essential (primary) hypertension: Secondary | ICD-10-CM | POA: Diagnosis not present

## 2018-10-21 DIAGNOSIS — I5032 Chronic diastolic (congestive) heart failure: Secondary | ICD-10-CM | POA: Diagnosis not present

## 2018-10-21 NOTE — Patient Instructions (Signed)
Medication Instructions:  NO CHANGE If you need a refill on your cardiac medications before your next appointment, please call your pharmacy.   Lab work: If you have labs (blood work) drawn today and your tests are completely normal, you will receive your results only by: . MyChart Message (if you have MyChart) OR . A paper copy in the mail If you have any lab test that is abnormal or we need to change your treatment, we will call you to review the results.  Follow-Up: At [facility], you and your health needs are our priority.  As part of our continuing mission to provide you with exceptional heart care, we have created designated Provider Care Teams.  These Care Teams include your primary Cardiologist (physician) and Advanced Practice Providers (APPs -  Physician Assistants and Nurse Practitioners) who all work together to provide you with the care you need, when you need it. You will need a follow up appointment in 12 months.  Please call our office 2 months in advance to schedule this appointment.  You may see Brian Crenshaw, MD or one of the following Advanced Practice Providers on your designated Care Team:   Luke Kilroy, PA-C Krista Kroeger, PA-C . Callie Goodrich, PA-C     

## 2018-12-28 ENCOUNTER — Other Ambulatory Visit: Payer: Self-pay

## 2018-12-28 ENCOUNTER — Ambulatory Visit
Admission: RE | Admit: 2018-12-28 | Discharge: 2018-12-28 | Disposition: A | Discharge status: Home / Self Care | Payer: Medicare Other | Source: Ambulatory Visit | Attending: Internal Medicine | Admitting: Internal Medicine

## 2018-12-28 DIAGNOSIS — M810 Age-related osteoporosis without current pathological fracture: Secondary | ICD-10-CM

## 2019-05-09 DIAGNOSIS — M25562 Pain in left knee: Secondary | ICD-10-CM | POA: Diagnosis not present

## 2019-05-18 DIAGNOSIS — E782 Mixed hyperlipidemia: Secondary | ICD-10-CM | POA: Diagnosis not present

## 2019-07-03 DIAGNOSIS — H401131 Primary open-angle glaucoma, bilateral, mild stage: Secondary | ICD-10-CM | POA: Diagnosis not present

## 2019-08-14 ENCOUNTER — Other Ambulatory Visit: Payer: Self-pay | Admitting: Family Medicine

## 2019-08-14 ENCOUNTER — Other Ambulatory Visit: Payer: Self-pay | Admitting: Internal Medicine

## 2019-08-14 DIAGNOSIS — Z1231 Encounter for screening mammogram for malignant neoplasm of breast: Secondary | ICD-10-CM

## 2019-08-18 DIAGNOSIS — E782 Mixed hyperlipidemia: Secondary | ICD-10-CM | POA: Diagnosis not present

## 2019-08-18 DIAGNOSIS — E039 Hypothyroidism, unspecified: Secondary | ICD-10-CM | POA: Diagnosis not present

## 2019-08-18 DIAGNOSIS — G629 Polyneuropathy, unspecified: Secondary | ICD-10-CM | POA: Diagnosis not present

## 2019-08-18 DIAGNOSIS — G619 Inflammatory polyneuropathy, unspecified: Secondary | ICD-10-CM | POA: Diagnosis not present

## 2019-09-21 ENCOUNTER — Ambulatory Visit
Admission: RE | Admit: 2019-09-21 | Discharge: 2019-09-21 | Disposition: A | Discharge status: Home / Self Care | Payer: Medicare Other | Source: Ambulatory Visit | Attending: Family Medicine | Admitting: Family Medicine

## 2019-09-21 ENCOUNTER — Other Ambulatory Visit: Payer: Self-pay

## 2019-09-21 DIAGNOSIS — Z1231 Encounter for screening mammogram for malignant neoplasm of breast: Secondary | ICD-10-CM

## 2019-11-09 NOTE — Progress Notes (Signed)
HPI: FU history of congestive heart failure felt secondary to diastolic dysfunction, paroxysmal atrial fibrillation in the setting of hyperthyroidism and bradycardia. Note, she had her last echocardiogram performed on February 04, 2006, that showed normal LV function and trivial mitral regurgitation and tricuspid regurgitation. She had a Myoview performed in Oct 2011 that showed soft tissue attenuation but no ischemia and an ejection fraction of 77%. Since I last saw her,she denies dyspnea, chest pain, palpitations, presyncope or syncope.  Current Outpatient Medications  Medication Sig Dispense Refill  . acetaminophen (TYLENOL) 500 MG tablet Take 500 mg by mouth every 6 (six) hours as needed for pain.    Marland Kitchen aspirin EC 81 MG tablet Take 81 mg by mouth daily.    . brimonidine (ALPHAGAN P) 0.1 % SOLN Place 1 drop into both eyes every 8 (eight) hours.     . Calcium Carbonate (CALTRATE 600 PO) Take 1 tablet by mouth daily.     . fluticasone (VERAMYST) 27.5 MCG/SPRAY nasal spray Place 2 sprays into the nose daily.    Marland Kitchen gabapentin (NEURONTIN) 600 MG tablet Take 600 mg by mouth 3 (three) times daily.    Marland Kitchen levothyroxine (SYNTHROID) 75 MCG tablet Take 75 mcg by mouth every morning.    . montelukast (SINGULAIR) 10 MG tablet Take 10 mg by mouth at bedtime.      . sodium chloride (OCEAN) 0.65 % SOLN nasal spray Place 1 spray into both nostrils as needed for congestion.    . Travoprost, BAK Free, (TRAVATAMN) 0.004 % SOLN ophthalmic solution Place 1 drop into both eyes at bedtime.      . Vitamin D, Ergocalciferol, (DRISDOL) 1.25 MG (50000 UT) CAPS capsule Take 50,000 Units by mouth every 7 (seven) days.     No current facility-administered medications for this visit.     Past Medical History:  Diagnosis Date  . ATRIAL FIBRILLATION, PAROXYSMAL   . BRADYCARDIA   . Congestive heart failure, unspecified   . COPD   . GERD   . HYPERTENSION   . HYPOTHYROIDISM   . OSTEOARTHRITIS     Past Surgical  History:  Procedure Laterality Date  . THYROIDECTOMY      Social History   Socioeconomic History  . Marital status: Single    Spouse name: Not on file  . Number of children: Not on file  . Years of education: Not on file  . Highest education level: Not on file  Occupational History  . Not on file  Tobacco Use  . Smoking status: Never Smoker  . Smokeless tobacco: Never Used  Substance and Sexual Activity  . Alcohol use: No  . Drug use: No  . Sexual activity: Not on file  Other Topics Concern  . Not on file  Social History Narrative  . Not on file   Social Determinants of Health   Financial Resource Strain:   . Difficulty of Paying Living Expenses: Not on file  Food Insecurity:   . Worried About Charity fundraiser in the Last Year: Not on file  . Ran Out of Food in the Last Year: Not on file  Transportation Needs:   . Lack of Transportation (Medical): Not on file  . Lack of Transportation (Non-Medical): Not on file  Physical Activity:   . Days of Exercise per Week: Not on file  . Minutes of Exercise per Session: Not on file  Stress:   . Feeling of Stress : Not on file  Social Connections:   .  Frequency of Communication with Friends and Family: Not on file  . Frequency of Social Gatherings with Friends and Family: Not on file  . Attends Religious Services: Not on file  . Active Member of Clubs or Organizations: Not on file  . Attends Archivist Meetings: Not on file  . Marital Status: Not on file  Intimate Partner Violence:   . Fear of Current or Ex-Partner: Not on file  . Emotionally Abused: Not on file  . Physically Abused: Not on file  . Sexually Abused: Not on file    History reviewed. No pertinent family history.  ROS: Knee pain but no fevers or chills, productive cough, hemoptysis, dysphasia, odynophagia, melena, hematochezia, dysuria, hematuria, rash, seizure activity, orthopnea, PND, pedal edema, claudication. Remaining systems are  negative.  Physical Exam: Well-developed well-nourished in no acute distress.  Skin is warm and dry.  HEENT is normal.  Neck is supple.  Chest is clear to auscultation with normal expansion.  Cardiovascular exam is regular and bradycardic Abdominal exam nontender or distended. No masses palpated. Extremities show no edema. neuro grossly intact  ECG-Marked sinus bradycardia at a rate of 39, cannot rule out septal infarct, inferior lateral T wave inversion.  Personally reviewed  A/P  1 paroxysmal atrial fibrillation-this previously occurred in the setting of hyperthyroidism which was then treated.  She has not had recurrences.  2 chronic diastolic congestive heart failure-patient is euvolemic.  We will continue fluid restriction and low-sodium diet.    3 hypertension-patient's blood pressure is controlled.  Continue present medical regimen.  4 bradycardia-patient has had documented bradycardia previously.  She has not had syncope.  No indication for further intervention at this point.  Kirk Ruths, MD

## 2019-11-20 ENCOUNTER — Other Ambulatory Visit: Payer: Self-pay

## 2019-11-20 ENCOUNTER — Encounter: Payer: Self-pay | Admitting: Cardiology

## 2019-11-20 ENCOUNTER — Ambulatory Visit (INDEPENDENT_AMBULATORY_CARE_PROVIDER_SITE_OTHER): Payer: Medicare Other | Admitting: Cardiology

## 2019-11-20 VITALS — BP 116/64 | HR 39 | Ht 63.0 in | Wt 165.6 lb

## 2019-11-20 DIAGNOSIS — I480 Paroxysmal atrial fibrillation: Secondary | ICD-10-CM

## 2019-11-20 DIAGNOSIS — I5032 Chronic diastolic (congestive) heart failure: Secondary | ICD-10-CM | POA: Diagnosis not present

## 2019-11-20 NOTE — Patient Instructions (Signed)
Medication Instructions:  .ISNTSCU  *If you need a refill on your cardiac medications before your next appointment, please call your pharmacy*  Lab Work: NONE  Testing/Procedures: NONE  Follow-Up: At [facility], you and your health needs are our priority.  As part of our continuing mission to provide you with exceptional heart care, we have created designated Provider Care Teams.  These Care Teams include your primary Cardiologist (physician) and Advanced Practice Providers (APPs -  Physician Assistants and Nurse Practitioners) who all work together to provide you with the care you need, when you need it.  We recommend signing up for the patient portal called "MyChart".  Sign up information is provided on this After Visit Summary.  MyChart is used to connect with patients for Virtual Visits (Telemedicine).  Patients are able to view lab/test results, encounter notes, upcoming appointments, etc.  Non-urgent messages can be sent to your provider as well.   To learn more about what you can do with MyChart, go to [email or web address].    Your next appointment:   12 month(s) You will receive a reminder letter in the mail two months in advance. If you don't receive a letter, please call our office to schedule the follow-up appointment.  The format for your next appointment:   In Person  Provider:   You may see Kirk Ruths, MD or one of the following Advanced Practice Providers on your designated Care Team:    Kerin Ransom, PA-C  [place], [place]  Coletta Memos, [place]

## 2019-11-21 DIAGNOSIS — Z23 Encounter for immunization: Secondary | ICD-10-CM | POA: Diagnosis not present

## 2019-11-21 DIAGNOSIS — G6289 Other specified polyneuropathies: Secondary | ICD-10-CM | POA: Diagnosis not present

## 2019-11-21 DIAGNOSIS — E782 Mixed hyperlipidemia: Secondary | ICD-10-CM | POA: Diagnosis not present

## 2019-11-21 DIAGNOSIS — E039 Hypothyroidism, unspecified: Secondary | ICD-10-CM | POA: Diagnosis not present

## 2019-11-21 DIAGNOSIS — M810 Age-related osteoporosis without current pathological fracture: Secondary | ICD-10-CM | POA: Diagnosis not present

## 2019-11-21 DIAGNOSIS — G619 Inflammatory polyneuropathy, unspecified: Secondary | ICD-10-CM | POA: Diagnosis not present

## 2020-01-06 DIAGNOSIS — E039 Hypothyroidism, unspecified: Secondary | ICD-10-CM | POA: Diagnosis not present

## 2020-01-06 DIAGNOSIS — M810 Age-related osteoporosis without current pathological fracture: Secondary | ICD-10-CM | POA: Diagnosis not present

## 2020-01-06 DIAGNOSIS — E7849 Other hyperlipidemia: Secondary | ICD-10-CM | POA: Diagnosis not present

## 2020-02-20 DIAGNOSIS — G629 Polyneuropathy, unspecified: Secondary | ICD-10-CM | POA: Diagnosis not present

## 2020-02-20 DIAGNOSIS — E039 Hypothyroidism, unspecified: Secondary | ICD-10-CM | POA: Diagnosis not present

## 2020-02-20 DIAGNOSIS — M810 Age-related osteoporosis without current pathological fracture: Secondary | ICD-10-CM | POA: Diagnosis not present

## 2020-03-08 DIAGNOSIS — M810 Age-related osteoporosis without current pathological fracture: Secondary | ICD-10-CM | POA: Diagnosis not present

## 2020-03-08 DIAGNOSIS — E039 Hypothyroidism, unspecified: Secondary | ICD-10-CM | POA: Diagnosis not present

## 2020-03-08 DIAGNOSIS — E7849 Other hyperlipidemia: Secondary | ICD-10-CM | POA: Diagnosis not present

## 2020-03-22 DIAGNOSIS — G619 Inflammatory polyneuropathy, unspecified: Secondary | ICD-10-CM | POA: Diagnosis not present

## 2020-03-22 DIAGNOSIS — E782 Mixed hyperlipidemia: Secondary | ICD-10-CM | POA: Diagnosis not present

## 2020-03-22 DIAGNOSIS — E039 Hypothyroidism, unspecified: Secondary | ICD-10-CM | POA: Diagnosis not present

## 2020-03-22 DIAGNOSIS — G629 Polyneuropathy, unspecified: Secondary | ICD-10-CM | POA: Diagnosis not present

## 2020-03-22 DIAGNOSIS — M810 Age-related osteoporosis without current pathological fracture: Secondary | ICD-10-CM | POA: Diagnosis not present

## 2020-03-28 ENCOUNTER — Other Ambulatory Visit: Payer: Self-pay | Admitting: Family Medicine

## 2020-03-28 DIAGNOSIS — R5381 Other malaise: Secondary | ICD-10-CM

## 2020-05-06 DIAGNOSIS — M810 Age-related osteoporosis without current pathological fracture: Secondary | ICD-10-CM | POA: Diagnosis not present

## 2020-05-06 DIAGNOSIS — E039 Hypothyroidism, unspecified: Secondary | ICD-10-CM | POA: Diagnosis not present

## 2020-05-06 DIAGNOSIS — E7849 Other hyperlipidemia: Secondary | ICD-10-CM | POA: Diagnosis not present

## 2020-05-14 DIAGNOSIS — E785 Hyperlipidemia, unspecified: Secondary | ICD-10-CM | POA: Diagnosis not present

## 2020-05-14 DIAGNOSIS — M810 Age-related osteoporosis without current pathological fracture: Secondary | ICD-10-CM | POA: Diagnosis not present

## 2020-05-14 DIAGNOSIS — E039 Hypothyroidism, unspecified: Secondary | ICD-10-CM | POA: Diagnosis not present

## 2020-05-14 DIAGNOSIS — E782 Mixed hyperlipidemia: Secondary | ICD-10-CM | POA: Diagnosis not present

## 2020-05-28 DIAGNOSIS — E7849 Other hyperlipidemia: Secondary | ICD-10-CM | POA: Diagnosis not present

## 2020-05-28 DIAGNOSIS — Z0000 Encounter for general adult medical examination without abnormal findings: Secondary | ICD-10-CM | POA: Diagnosis not present

## 2020-05-28 DIAGNOSIS — E039 Hypothyroidism, unspecified: Secondary | ICD-10-CM | POA: Diagnosis not present

## 2020-06-06 DIAGNOSIS — E7849 Other hyperlipidemia: Secondary | ICD-10-CM | POA: Diagnosis not present

## 2020-06-06 DIAGNOSIS — Z780 Asymptomatic menopausal state: Secondary | ICD-10-CM | POA: Diagnosis not present

## 2020-06-06 DIAGNOSIS — M8588 Other specified disorders of bone density and structure, other site: Secondary | ICD-10-CM | POA: Diagnosis not present

## 2020-06-06 DIAGNOSIS — M810 Age-related osteoporosis without current pathological fracture: Secondary | ICD-10-CM | POA: Diagnosis not present

## 2020-06-06 DIAGNOSIS — E039 Hypothyroidism, unspecified: Secondary | ICD-10-CM | POA: Diagnosis not present

## 2020-06-10 DIAGNOSIS — E782 Mixed hyperlipidemia: Secondary | ICD-10-CM | POA: Diagnosis not present

## 2020-06-10 DIAGNOSIS — G629 Polyneuropathy, unspecified: Secondary | ICD-10-CM | POA: Diagnosis not present

## 2020-06-10 DIAGNOSIS — N3281 Overactive bladder: Secondary | ICD-10-CM | POA: Diagnosis not present

## 2020-06-10 DIAGNOSIS — E039 Hypothyroidism, unspecified: Secondary | ICD-10-CM | POA: Diagnosis not present

## 2020-06-20 DIAGNOSIS — H401131 Primary open-angle glaucoma, bilateral, mild stage: Secondary | ICD-10-CM | POA: Diagnosis not present

## 2020-06-20 DIAGNOSIS — H524 Presbyopia: Secondary | ICD-10-CM | POA: Diagnosis not present

## 2020-06-20 DIAGNOSIS — H25013 Cortical age-related cataract, bilateral: Secondary | ICD-10-CM | POA: Diagnosis not present

## 2020-06-20 DIAGNOSIS — H2513 Age-related nuclear cataract, bilateral: Secondary | ICD-10-CM | POA: Diagnosis not present

## 2020-07-22 DIAGNOSIS — E7849 Other hyperlipidemia: Secondary | ICD-10-CM | POA: Diagnosis not present

## 2020-08-06 DIAGNOSIS — M810 Age-related osteoporosis without current pathological fracture: Secondary | ICD-10-CM | POA: Diagnosis not present

## 2020-08-06 DIAGNOSIS — E7849 Other hyperlipidemia: Secondary | ICD-10-CM | POA: Diagnosis not present

## 2020-08-06 DIAGNOSIS — E039 Hypothyroidism, unspecified: Secondary | ICD-10-CM | POA: Diagnosis not present

## 2020-08-12 DIAGNOSIS — E782 Mixed hyperlipidemia: Secondary | ICD-10-CM | POA: Diagnosis not present

## 2020-08-12 DIAGNOSIS — E7849 Other hyperlipidemia: Secondary | ICD-10-CM | POA: Diagnosis not present

## 2020-08-12 DIAGNOSIS — E039 Hypothyroidism, unspecified: Secondary | ICD-10-CM | POA: Diagnosis not present

## 2020-08-13 ENCOUNTER — Other Ambulatory Visit: Payer: Self-pay | Admitting: Family Medicine

## 2020-08-13 DIAGNOSIS — G629 Polyneuropathy, unspecified: Secondary | ICD-10-CM | POA: Diagnosis not present

## 2020-08-13 DIAGNOSIS — Z1231 Encounter for screening mammogram for malignant neoplasm of breast: Secondary | ICD-10-CM

## 2020-08-13 DIAGNOSIS — E034 Atrophy of thyroid (acquired): Secondary | ICD-10-CM | POA: Diagnosis not present

## 2020-08-13 DIAGNOSIS — E782 Mixed hyperlipidemia: Secondary | ICD-10-CM | POA: Diagnosis not present

## 2020-08-22 ENCOUNTER — Ambulatory Visit (AMBULATORY_SURGERY_CENTER): Payer: Medicare Other

## 2020-08-22 ENCOUNTER — Other Ambulatory Visit: Payer: Self-pay

## 2020-08-22 VITALS — Ht 63.0 in | Wt 162.0 lb

## 2020-08-22 DIAGNOSIS — Z1211 Encounter for screening for malignant neoplasm of colon: Secondary | ICD-10-CM

## 2020-08-22 MED ORDER — NA SULFATE-K SULFATE-MG SULF 17.5-3.13-1.6 GM/177ML PO SOLN: 1.0000 | Freq: Once | ORAL | 0 refills | Status: AC

## 2020-08-22 NOTE — Progress Notes (Signed)
No egg or soy allergy known to patient  No issues with past sedation with any surgeries or procedures Patient denies ever being told they had issues or difficulty with intubation  No FH of Malignant Hyperthermia No diet pills per patient No home 02 use per patient  No blood thinners per patient  Pt denies issues with constipation  AFIB dx  COVID 19 guidelines implemented in PV today with Pt and RN  Pt is fully vaccinated for Covid   NO PA's for preps discussed with pt in PV today  Discussed with pt there will be an out-of-pocket cost for prep and that varies from $0 to 70 dollars   Due to the COVID-19 pandemic we are asking patients to follow certain guidelines.  Pt aware of COVID protocols and [facility] guidelines

## 2020-09-05 ENCOUNTER — Other Ambulatory Visit: Payer: Self-pay

## 2020-09-05 ENCOUNTER — Ambulatory Visit (AMBULATORY_SURGERY_CENTER): Payer: Medicare Other | Admitting: Gastroenterology

## 2020-09-05 ENCOUNTER — Encounter: Payer: Self-pay | Admitting: Gastroenterology

## 2020-09-05 VITALS — BP 153/83 | HR 50 | Temp 97.1°F | Resp 10 | Ht 63.0 in | Wt 162.0 lb

## 2020-09-05 DIAGNOSIS — Z1211 Encounter for screening for malignant neoplasm of colon: Secondary | ICD-10-CM

## 2020-09-05 MED ORDER — SODIUM CHLORIDE 0.9 % IV SOLN: 500.0000 mL | Freq: Once | INTRAVENOUS | Status: DC

## 2020-09-05 NOTE — Progress Notes (Signed)
VS by CW  I have reviewed the patient's medical history in detail and updated the computerized patient record.  

## 2020-09-05 NOTE — Patient Instructions (Signed)
YOU HAD AN ENDOSCOPIC PROCEDURE TODAY AT THE [facility]:   Refer to the procedure report that was given to you for any specific questions about what was found during the examination.  If the procedure report does not answer your questions, please call your gastroenterologist to clarify.  If you requested that your care partner not be given the details of your procedure findings, then the procedure report has been included in a sealed envelope for you to review at your convenience later.  YOU SHOULD EXPECT: Some feelings of bloating in the abdomen. Passage of more gas than usual.  Walking can help get rid of the air that was put into your GI tract during the procedure and reduce the bloating. If you had a lower endoscopy (such as a colonoscopy or flexible sigmoidoscopy) you may notice spotting of blood in your stool or on the toilet paper. If you underwent a bowel prep for your procedure, you may not have a normal bowel movement for a few days.  Please Note:  You might notice some irritation and congestion in your nose or some drainage.  This is from the oxygen used during your procedure.  There is no need for concern and it should clear up in a day or so.  SYMPTOMS TO REPORT IMMEDIATELY:   Following lower endoscopy (colonoscopy or flexible sigmoidoscopy):  Excessive amounts of blood in the stool  Significant tenderness or worsening of abdominal pains  Swelling of the abdomen that is new, acute  Fever of 100F or higher  For urgent or emergent issues, a gastroenterologist can be reached at any hour by calling [phone]. Do not use MyChart messaging for urgent concerns.    DIET:  We do recommend a small meal at first, but then you may proceed to your regular diet.  Drink plenty of fluids but you should avoid alcoholic beverages for 24 hours.  ACTIVITY:  You should plan to take it easy for the rest of today and you should NOT DRIVE or use heavy machinery until tomorrow (because  of the sedation medicines used during the test).    FOLLOW UP: Our staff will call the number listed on your records 48-72 hours following your procedure to check on you and address any questions or concerns that you may have regarding the information given to you following your procedure. If we do not reach you, we will leave a message.  We will attempt to reach you two times.  During this call, we will ask if you have developed any symptoms of COVID 19. If you develop any symptoms (ie: fever, flu-like symptoms, shortness of breath, cough etc.) before then, please call [phone].  If you test positive for Covid 19 in the 2 weeks post procedure, please call and report this information to us.    If any biopsies were taken you will be contacted by phone or by letter within the next 1-3 weeks.  Please call us at [phone] if you have not heard about the biopsies in 3 weeks.    SIGNATURES/CONFIDENTIALITY: You and/or your care partner have signed paperwork which will be entered into your electronic medical record.  These signatures attest to the fact that that the information above on your After Visit Summary has been reviewed and is understood.  Full responsibility of the confidentiality of this discharge information lies with you and/or your care-partner. 

## 2020-09-05 NOTE — Op Note (Signed)
[facility] Patient Name: [name] [name] Procedure Date: 09/05/2020 9:38 AM MRN: [id number] Endoscopist: Thornton Park MD, MD Age: 70 Referring MD:  Date of Birth: 1950-07-04 Gender: Female Account #: [id number] Procedure:                Colonoscopy Indications:              Screening for colorectal malignant neoplasm                           Polypoid mucosa removed during colonoscopy in 2011                            (path results available in EPIC but not the                            procedure report, patient does not remember where                            the procedure was performed)                           No known family history of colon cancer or polyps Medicines:                Monitored Anesthesia Care Procedure:                Pre-Anesthesia Assessment:                           - Prior to the procedure, a History and Physical                            was performed, and patient medications and                            allergies were reviewed. The patient's tolerance of                            previous anesthesia was also reviewed. The risks                            and benefits of the procedure and the sedation                            options and risks were discussed with the patient.                            All questions were answered, and informed consent                            was obtained. Prior Anticoagulants: The patient has                            taken no previous anticoagulant or antiplatelet  agents. ASA Grade Assessment: III - A patient with                            severe systemic disease. After reviewing the risks                            and benefits, the patient was deemed in                            satisfactory condition to undergo the procedure.                           After obtaining informed consent, the colonoscope                            was passed under direct vision.  Throughout the                            procedure, the patient's blood pressure, pulse, and                            oxygen saturations were monitored continuously. The                            Colonoscope was introduced through the anus and                            advanced to the 3 cm into the ileum. The                            colonoscopy was performed without difficulty. The                            patient tolerated the procedure well. The quality                            of the bowel preparation was good. The terminal                            ileum, ileocecal valve, appendiceal orifice, and                            rectum were photographed. Scope In: 10:04:12 AM Scope Out: 10:18:55 AM Scope Withdrawal Time: 0 hours 10 minutes 12 seconds  Total Procedure Duration: 0 hours 14 minutes 43 seconds  Findings:                 The perianal and digital rectal examinations were                            normal.                           The entire examined colon appeared normal on direct  and retroflexion views. Complications:            No immediate complications. Estimated Blood Loss:     Estimated blood loss: none. Impression:               - The entire examined colon is normal on direct and                            retroflexion views.                           - No specimens collected. Recommendation:           - Patient has a contact number available for                            emergencies. The signs and symptoms of potential                            delayed complications were discussed with the                            patient. Return to normal activities tomorrow.                            Written discharge instructions were provided to the                            patient.                           - Resume previous diet.                           - Continue present medications.                           - Repeat colonoscopy  in 10 years for surveillance                            if it is clinically appropriate at that time.                            Colonoscopy is considered between the ages of 38                            and 20 after carefully weighing the risks and                            benefits.                           - Emerging evidence supports eating a diet of                            fruits, vegetables, grains, calcium, and yogurt  while reducing red meat and alcohol may reduce the                            risk of colon cancer.                           - Thank you for allowing me to be involved in your                            colon cancer prevention. Thornton Park MD, MD 09/05/2020 10:24:13 AM This report has been signed electronically.

## 2020-09-05 NOTE — Progress Notes (Signed)
To PACU, VSS. Report to [email or web address] 

## 2020-09-10 ENCOUNTER — Telehealth: Payer: Self-pay

## 2020-09-10 NOTE — Telephone Encounter (Signed)
  Follow up Call-  Call back number 09/05/2020  Post procedure Call Back phone  # [phone]  Permission to leave phone message Yes  Some recent data might be hidden     Patient questions:  Do you have a fever, pain , or abdominal swelling? No. Pain Score  0 *  Have you tolerated food without any problems? Yes.    Have you been able to return to your normal activities? Yes.    Do you have any questions about your discharge instructions: Diet   No. Medications  No. Follow up visit  No.  Do you have questions or concerns about your Care? No.  Actions: * If pain score is 4 or above: No action needed, pain <4.  Have you developed a fever since your procedure? no  2.   Have you had an respiratory symptoms (SOB or cough) since your procedure? no  3.   Have you tested positive for COVID 19 since your procedure no  4.   Have you had any family members/close contacts diagnosed with the COVID 19 since your procedure?  no   If yes to any of these questions please route to Joylene John, RN and Joella Prince, RN

## 2020-09-24 DIAGNOSIS — M25562 Pain in left knee: Secondary | ICD-10-CM | POA: Diagnosis not present

## 2020-09-24 DIAGNOSIS — M25561 Pain in right knee: Secondary | ICD-10-CM | POA: Diagnosis not present

## 2020-09-25 ENCOUNTER — Telehealth: Payer: Self-pay | Admitting: *Deleted

## 2020-09-25 DIAGNOSIS — M25562 Pain in left knee: Secondary | ICD-10-CM | POA: Diagnosis not present

## 2020-09-25 NOTE — Telephone Encounter (Signed)
   [facility] Pre-operative Risk Assessment    Patient Name: ARIENNE GARTIN  DOB: 23-Dec-1950 MRN: [id number]  HEARTCARE STAFF:  - IMPORTANT!!!!!! Under Visit Info/Reason for Call, type in Other and utilize the format Clearance MM/DD/YY or Clearance TBD. Do not use dashes or single digits. - Please review there is not already an duplicate clearance open for this procedure. - If request is for dental extraction, please clarify the # of teeth to be extracted. - If the patient is currently at the dentist's office, call Pre-Op Callback Staff (MA/nurse) to input urgent request.  - If the patient is not currently in the dentist office, please route to the Pre-Op pool.  Request for surgical clearance:  What type of surgery is being performed? Lt total knee replacement  When is this surgery scheduled? TBD  What type of clearance is required (medical clearance vs. Pharmacy clearance to hold med vs. Both)? medical  Are there any medications that need to be held prior to surgery and how long? none  Practice name and name of physician performing surgery? [facility]  What is the office phone number? [phone]   7.   What is the office fax number? [phone]  8.   Anesthesia type (None, local, MAC, general) ? Not listed   Fredia Beets 09/25/2020, 5:01 PM  _________________________________________________________________   (provider comments below)

## 2020-09-30 NOTE — Telephone Encounter (Signed)
I have reviewed schedules at multiple locations and am not able to find an appt available. I am going to ask our scheduling teams to see if they can assist in helping getting the pt scheduled for pre op clearance.

## 2020-09-30 NOTE — Telephone Encounter (Signed)
Primary Cardiologist:Brian Stanford Breed, MD  Chart reviewed as part of pre-operative protocol coverage. Because of [name] [name]'s past medical history and time since last visit, he/she will require a follow-up visit in order to better assess preoperative cardiovascular risk.  Pre-op covering staff: - Please schedule appointment and call patient to inform them. - Please contact requesting surgeon's office via preferred method (i.e, phone, fax) to inform them of need for appointment prior to surgery.  If applicable, this message will also be routed to pharmacy pool and/or primary cardiologist for input on holding anticoagulant/antiplatelet agent as requested below so that this information is available at time of patient's appointment.   Deberah Pelton, NP  09/30/2020, 9:43 AM

## 2020-10-01 NOTE — Telephone Encounter (Signed)
Spoke with patient and got her scheduled to see Coletta Memos, NP on December 03, 2020 at 8:15pm.   Patient voiced understanding.   Address, directions and phone number given to patient.

## 2020-10-08 ENCOUNTER — Ambulatory Visit
Admission: RE | Admit: 2020-10-08 | Discharge: 2020-10-08 | Disposition: A | Discharge status: Home / Self Care | Payer: Medicare Other | Source: Ambulatory Visit | Attending: Family Medicine | Admitting: Family Medicine

## 2020-10-08 ENCOUNTER — Other Ambulatory Visit: Payer: Self-pay

## 2020-10-08 ENCOUNTER — Ambulatory Visit: Payer: Medicare Other

## 2020-10-08 DIAGNOSIS — Z1231 Encounter for screening mammogram for malignant neoplasm of breast: Secondary | ICD-10-CM

## 2020-10-10 DIAGNOSIS — N3281 Overactive bladder: Secondary | ICD-10-CM | POA: Diagnosis not present

## 2020-10-10 DIAGNOSIS — I10 Essential (primary) hypertension: Secondary | ICD-10-CM | POA: Diagnosis not present

## 2020-10-10 DIAGNOSIS — M130 Polyarthritis, unspecified: Secondary | ICD-10-CM | POA: Diagnosis not present

## 2020-10-10 DIAGNOSIS — J019 Acute sinusitis, unspecified: Secondary | ICD-10-CM | POA: Diagnosis not present

## 2020-10-10 DIAGNOSIS — E039 Hypothyroidism, unspecified: Secondary | ICD-10-CM | POA: Diagnosis not present

## 2020-10-10 DIAGNOSIS — E782 Mixed hyperlipidemia: Secondary | ICD-10-CM | POA: Diagnosis not present

## 2020-10-14 NOTE — Progress Notes (Signed)
HPI: FU history of congestive heart failure felt secondary to diastolic dysfunction, paroxysmal atrial fibrillation in the setting of hyperthyroidism and bradycardia. Note, she had her last echocardiogram performed on February 04, 2006, that showed normal LV function and trivial mitral regurgitation and tricuspid regurgitation. She had a Myoview performed in Oct 2011 that showed soft tissue attenuation but no ischemia and an ejection fraction of 77%. Since I last saw her, she has dyspnea with more vigorous activities but not routine activities.  No chest pain.  Occasional dizziness with standing but no syncope.  Note she can walk 1 to 2 miles without having dyspnea or chest pain.  She is scheduled for knee replacement and we were asked to evaluate preoperatively.  Current Outpatient Medications  Medication Sig Dispense Refill   acetaminophen (TYLENOL) 500 MG tablet Take 500 mg by mouth every 6 (six) hours as needed for pain.     aspirin EC 81 MG tablet Take 81 mg by mouth daily.     brimonidine (ALPHAGAN P) 0.1 % SOLN Place 1 drop into both eyes every 8 (eight) hours.      brimonidine (ALPHAGAN) 0.2 % ophthalmic solution Place 1 drop into both eyes 3 (three) times daily.     Calcium Carbonate (CALTRATE 600 PO) Take 1 tablet by mouth daily.      citalopram (CELEXA) 20 MG tablet Take 20 mg by mouth daily.     fluticasone (VERAMYST) 27.5 MCG/SPRAY nasal spray Place 2 sprays into the nose daily. (Patient not taking: Reported on 08/22/2020)     gabapentin (NEURONTIN) 600 MG tablet Take 600 mg by mouth 3 (three) times daily.     gabapentin (NEURONTIN) 800 MG tablet Take 1 tablet by mouth 3 (three) times daily. Take 1 tab in AM and 2 tabs QHS     levothyroxine (SYNTHROID) 75 MCG tablet Take 75 mcg by mouth every morning.     montelukast (SINGULAIR) 10 MG tablet Take 10 mg by mouth at bedtime.       rosuvastatin (CRESTOR) 10 MG tablet Take 10 mg by mouth 3 (three) times a week.     sodium chloride  (OCEAN) 0.65 % SOLN nasal spray Place 1 spray into both nostrils as needed for congestion.     spironolactone (ALDACTONE) 25 MG tablet Take 1 tablet by mouth every morning.     Travoprost, BAK Free, (TRAVATAMN) 0.004 % SOLN ophthalmic solution Place 1 drop into both eyes at bedtime.       Vitamin D, Ergocalciferol, (DRISDOL) 1.25 MG (50000 UT) CAPS capsule Take 50,000 Units by mouth every 7 (seven) days.     No current facility-administered medications for this visit.     Past Medical History:  Diagnosis Date   ATRIAL FIBRILLATION, PAROXYSMAL    BRADYCARDIA    Cataract    not a surgical candidate at this time (08/22/2020)   Congestive heart failure, unspecified    COPD    Depression    not on meds at this time   GERD    diet controlled-on OTC PRN meds   HYPERTENSION    HYPOTHYROIDISM    on meds   OSTEOARTHRITIS    bilateral knees   Seasonal allergies     Past Surgical History:  Procedure Laterality Date   THYROIDECTOMY     VAGINAL HYSTERECTOMY     WISDOM TOOTH EXTRACTION      Social History   Socioeconomic History   Marital status: Single    Spouse  name: Not on file   Number of children: Not on file   Years of education: Not on file   Highest education level: Not on file  Occupational History   Not on file  Tobacco Use   Smoking status: Never   Smokeless tobacco: Never  Vaping Use   Vaping Use: Never used  Substance and Sexual Activity   Alcohol use: No   Drug use: No   Sexual activity: Not on file  Other Topics Concern   Not on file  Social History Narrative   Not on file   Social Determinants of Health   Financial Resource Strain: Not on file  Food Insecurity: Not on file  Transportation Needs: Not on file  Physical Activity: Not on file  Stress: Not on file  Social Connections: Not on file  Intimate Partner Violence: Not on file    Family History  Problem Relation Age of Onset   Colon polyps Neg Hx    Colon cancer Neg Hx    Esophageal  cancer Neg Hx    Stomach cancer Neg Hx    Rectal cancer Neg Hx     ROS: Bilateral knee pain right greater than left but no fevers or chills, productive cough, hemoptysis, dysphasia, odynophagia, melena, hematochezia, dysuria, hematuria, rash, seizure activity, orthopnea, PND, pedal edema, claudication. Remaining systems are negative.  Physical Exam: Well-developed well-nourished in no acute distress.  Skin is warm and dry.  HEENT is normal.  Neck is supple.  Chest is clear to auscultation with normal expansion.  Cardiovascular exam is regular rate and rhythm.  Abdominal exam nontender or distended. No masses palpated. Extremities show no edema. neuro grossly intact  ECG-sinus bradycardia at a rate of 44, nonspecific ST changes.  Personally reviewed  A/P  1 remote history of paroxysmal atrial fibrillation-occurring in the setting of hyperthyroidism.  This was treated and she has had no recurrences.  2 hypertension-patient's blood pressure is controlled.    3 chronic diastolic congestive heart failure-she is euvolemic.  Continue fluid restriction and low-sodium diet.  4 history of bradycardia-this is longstanding and persists.  However she is not symptomatic as she has not had increased fatigue or history of syncope.  Avoid AV nodal blocking agents.  5 preoperative evaluation-patient has good functional capacity.  She can walk 1 to 2 miles without having significant dyspnea or chest pain.  She is bradycardic but as outlined above she is asymptomatic.  She may proceed with knee replacement without further cardiac evaluation.  Kirk Ruths, MD

## 2020-10-17 ENCOUNTER — Ambulatory Visit (INDEPENDENT_AMBULATORY_CARE_PROVIDER_SITE_OTHER): Payer: Medicare Other | Admitting: Cardiology

## 2020-10-17 ENCOUNTER — Other Ambulatory Visit: Payer: Self-pay

## 2020-10-17 ENCOUNTER — Encounter: Payer: Self-pay | Admitting: Cardiology

## 2020-10-17 VITALS — BP 124/82 | HR 44 | Ht 62.0 in | Wt 160.4 lb

## 2020-10-17 DIAGNOSIS — I480 Paroxysmal atrial fibrillation: Secondary | ICD-10-CM | POA: Diagnosis not present

## 2020-10-17 DIAGNOSIS — I5032 Chronic diastolic (congestive) heart failure: Secondary | ICD-10-CM

## 2020-10-17 DIAGNOSIS — I10 Essential (primary) hypertension: Secondary | ICD-10-CM | POA: Diagnosis not present

## 2020-10-17 DIAGNOSIS — Z01810 Encounter for preprocedural cardiovascular examination: Secondary | ICD-10-CM

## 2020-10-17 NOTE — Patient Instructions (Signed)

## 2020-11-01 ENCOUNTER — Other Ambulatory Visit: Payer: Self-pay | Admitting: Physician Assistant

## 2020-11-06 DIAGNOSIS — E7849 Other hyperlipidemia: Secondary | ICD-10-CM | POA: Diagnosis not present

## 2020-11-06 DIAGNOSIS — E039 Hypothyroidism, unspecified: Secondary | ICD-10-CM | POA: Diagnosis not present

## 2020-11-06 DIAGNOSIS — M810 Age-related osteoporosis without current pathological fracture: Secondary | ICD-10-CM | POA: Diagnosis not present

## 2020-11-13 DIAGNOSIS — M25562 Pain in left knee: Secondary | ICD-10-CM | POA: Diagnosis not present

## 2020-11-19 NOTE — Progress Notes (Signed)
DUE TO COVID-19 ONLY ONE VISITOR IS ALLOWED TO COME WITH YOU AND STAY IN THE WAITING ROOM ONLY DURING PRE OP AND PROCEDURE DAY OF SURGERY.  2 VISITOR  MAY VISIT WITH YOU AFTER SURGERY IN YOUR PRIVATE ROOM DURING VISITING HOURS ONLY!  YOU NEED TO HAVE A COVID 19 TEST ON______AME'@_'$  '@_from'$  8am-3pm _____, THIS TEST MUST BE DONE BEFORE SURGERY,  Covid test is done at [place], [place] [address].  This is a drive thru.  No appt required. Please see map.                 Your procedure is scheduled on:  12/03/20   Report to [facility] Main  Entrance   Report to admitting at    [phone]     Call this number if you have problems the morning of surgery [phone]    REMEMBER: NO  SOLID FOOD CANDY OR GUM AFTER MIDNIGHT. CLEAR LIQUIDS UNTIL   0430am       . NOTHING BY MOUTH EXCEPT CLEAR LIQUIDS UNTIL   0430am  . PLEASE FINISH ENSURE DRINK PER SURGEON ORDER  WHICH NEEDS TO BE COMPLETED AT      0430am .      CLEAR LIQUID DIET   Foods Allowed                                                                    Coffee and tea, regular and decaf                            Fruit ices (not with fruit pulp)                                      Iced Popsicles                                    Carbonated beverages, regular and diet                                    Cranberry, grape and apple juices Sports drinks like Gatorade Lightly seasoned clear broth or consume(fat free) Sugar, honey syrup ___________________________________________________________________      BRUSH YOUR TEETH MORNING OF SURGERY AND RINSE YOUR MOUTH OUT, NO CHEWING GUM CANDY OR MINTS.     Take these medicines the morning of surgery with A SIP OF WATER:  celexa, flonase, gabapentin, synthroid   DO NOT TAKE ANY DIABETIC MEDICATIONS DAY OF YOUR SURGERY                               You may not have any metal on your body including hair pins and              piercings  Do not wear jewelry, make-up,  lotions, powders or perfumes, deodorant             Do not  wear nail polish on your fingernails.  Do not shave  48 hours prior to surgery.              Men may shave face and neck.   Do not bring valuables to the hospital. [place].  Contacts, dentures or bridgework may not be worn into surgery.  Leave suitcase in the car. After surgery it may be brought to your room.     Patients discharged the day of surgery will not be allowed to drive home. IF YOU ARE HAVING SURGERY AND GOING HOME THE SAME DAY, YOU MUST HAVE AN ADULT TO DRIVE YOU HOME AND BE WITH YOU FOR 24 HOURS. YOU MAY GO HOME BY TAXI OR UBER OR ORTHERWISE, BUT AN ADULT MUST ACCOMPANY YOU HOME AND STAY WITH YOU FOR 24 HOURS.  Name and phone number of your driver:  Special Instructions: N/A              Please read over the following fact sheets you were given: _____________________________________________________________________  [facility] - Preparing for Surgery Before surgery, you can play an important role.  Because skin is not sterile, your skin needs to be as free of germs as possible.  You can reduce the number of germs on your skin by washing with CHG (chlorahexidine gluconate) soap before surgery.  CHG is an antiseptic cleaner which kills germs and bonds with the skin to continue killing germs even after washing. Please DO NOT use if you have an allergy to CHG or antibacterial soaps.  If your skin becomes reddened/irritated stop using the CHG and inform your nurse when you arrive at Short Stay. Do not shave (including legs and underarms) for at least 48 hours prior to the first CHG shower.  You may shave your face/neck. Please follow these instructions carefully:  1.  Shower with CHG Soap the night before surgery and the  morning of Surgery.  2.  If you choose to wash your hair, wash your hair first as usual with your  normal  shampoo.  3.  After you shampoo, rinse your hair  and body thoroughly to remove the  shampoo.                           4.  Use CHG as you would any other liquid soap.  You can apply chg directly  to the skin and wash                       Gently with a scrungie or clean washcloth.  5.  Apply the CHG Soap to your body ONLY FROM THE NECK DOWN.   Do not use on face/ open                           Wound or open sores. Avoid contact with eyes, ears mouth and genitals (private parts).                       Wash face,  Genitals (private parts) with your normal soap.             6.  Wash thoroughly, paying special attention to the area where your surgery  will be performed.  7.  Thoroughly rinse your body with warm water  from the neck down.  8.  DO NOT shower/wash with your normal soap after using and rinsing off  the CHG Soap.                9.  Pat yourself dry with a clean towel.            10.  Wear clean pajamas.            11.  Place clean sheets on your bed the night of your first shower and do not  sleep with pets. Day of Surgery : Do not apply any lotions/deodorants the morning of surgery.  Please wear clean clothes to the hospital/surgery center.  FAILURE TO FOLLOW THESE INSTRUCTIONS MAY RESULT IN THE CANCELLATION OF YOUR SURGERY PATIENT SIGNATURE_________________________________  NURSE SIGNATURE__________________________________  ________________________________________________________________________

## 2020-11-20 ENCOUNTER — Other Ambulatory Visit: Payer: Self-pay

## 2020-11-20 ENCOUNTER — Encounter (HOSPITAL_COMMUNITY)
Admission: RE | Admit: 2020-11-20 | Discharge: 2020-11-20 | Disposition: A | Discharge status: Home / Self Care | Payer: Medicare Other | Source: Ambulatory Visit | Attending: Orthopedic Surgery | Admitting: Orthopedic Surgery

## 2020-11-20 ENCOUNTER — Encounter (HOSPITAL_COMMUNITY): Payer: Self-pay

## 2020-11-20 DIAGNOSIS — Z01818 Encounter for other preprocedural examination: Secondary | ICD-10-CM | POA: Diagnosis not present

## 2020-11-20 HISTORY — DX: Malignant (primary) neoplasm, unspecified: C80.1

## 2020-11-20 LAB — BASIC METABOLIC PANEL WITH GFR
Anion gap: 6 (ref 5–15)
BUN: 13 mg/dL (ref 8–23)
CO2: 28 mmol/L (ref 22–32)
Calcium: 9.4 mg/dL (ref 8.9–10.3)
Chloride: 104 mmol/L (ref 98–111)
Creatinine, Ser: 1.23 mg/dL — ABNORMAL HIGH (ref 0.44–1.00)
GFR, Estimated: 47 mL/min — ABNORMAL LOW
Glucose, Bld: 85 mg/dL (ref 70–99)
Potassium: 4.7 mmol/L (ref 3.5–5.1)
Sodium: 138 mmol/L (ref 135–145)

## 2020-11-20 LAB — CBC
HCT: 49 % — ABNORMAL HIGH (ref 36.0–46.0)
Hemoglobin: 15.8 g/dL — ABNORMAL HIGH (ref 12.0–15.0)
MCH: 31.6 pg (ref 26.0–34.0)
MCHC: 32.2 g/dL (ref 30.0–36.0)
MCV: 98 fL (ref 80.0–100.0)
Platelets: 166 10*3/uL (ref 150–400)
RBC: 5 MIL/uL (ref 3.87–5.11)
RDW: 14.4 % (ref 11.5–15.5)
WBC: 4.3 10*3/uL (ref 4.0–10.5)
nRBC: 0 % (ref 0.0–0.2)

## 2020-11-20 LAB — SURGICAL PCR SCREEN
MRSA, PCR: NEGATIVE
Staphylococcus aureus: POSITIVE — AB

## 2020-11-20 NOTE — Progress Notes (Addendum)
Anesthesia Review:  PCP: DR Lucianne Lei Requested LOV note for clearance and clearance letter on 11/20/20.  PT states she had to be cleared by PCP for surgery.  Cardiologist : DR Kirk Ruths- [place] 10/17/20  Chest x-ray : EKG : 10/17/20  hr 44  Echo : Stress test: Cardiac Cath :  Activity level: can do a flight of stairs without difficulty  Sleep Study/ CPAP : none  Fasting Blood Sugar :      / Checks Blood Sugar -- times a day:   Blood Thinner/ Instructions /Last Dose: ASA / Instructions/ Last Dose :   81 mg Aspirin No covid test- ambulatory surgery  Heart rate at preop was 48.  Pt asymptomatic.

## 2020-11-21 DIAGNOSIS — E782 Mixed hyperlipidemia: Secondary | ICD-10-CM | POA: Diagnosis not present

## 2020-11-21 DIAGNOSIS — N3281 Overactive bladder: Secondary | ICD-10-CM | POA: Diagnosis not present

## 2020-11-21 DIAGNOSIS — E039 Hypothyroidism, unspecified: Secondary | ICD-10-CM | POA: Diagnosis not present

## 2020-11-22 NOTE — Progress Notes (Signed)
Anesthesia Chart Review   Case: [id number] Date/Time: 12/03/20 0715   Procedure: TOTAL KNEE ARTHROPLASTY (Left: Knee)   Anesthesia type: Choice   Pre-op diagnosis: OA LEFT KNEE   Location: [facility] ROOM 08 / [facility]   Surgeons: Renette Butters, MD       DISCUSSION:70 y.o. former smoker with h/o HTN, CHF, remote h/o atrial fibrillation (in setting of hyperthyroidism, treated with no recurrence), left knee OA scheduled for above procedure 12/03/2020 with Dr. Edmonia Lynch.   Pt last seen by cardiology 10/17/20. Per OV note, "preoperative evaluation-patient has good functional capacity.  She can walk 1 to 2 miles without having significant dyspnea or chest pain.  She is bradycardic but as outlined above she is asymptomatic.  She may proceed with knee replacement without further cardiac evaluation."  Anticipate pt can proceed with planned procedure barring acute status change.   VS: BP (!) 133/91   Pulse (!) 48   Temp 36.6 C (Oral)   Resp 16   Ht '5\' 2"'$  (1.575 m)   Wt 72.8 kg   SpO2 100%   BMI 29.35 kg/m   PROVIDERS: Lucianne Lei, MD is PCP   Kirk Ruths, MD is Cardiologist  LABS: Labs reviewed: Acceptable for surgery. (all labs ordered are listed, but only abnormal results are displayed)  Labs Reviewed  SURGICAL PCR SCREEN - Abnormal; Notable for the following components:      Result Value   Staphylococcus aureus POSITIVE (*)    All other components within normal limits  BASIC METABOLIC PANEL - Abnormal; Notable for the following components:   Creatinine, Ser 1.23 (*)    GFR, Estimated 47 (*)    All other components within normal limits  CBC - Abnormal; Notable for the following components:   Hemoglobin 15.8 (*)    HCT 49.0 (*)    All other components within normal limits     IMAGES:   EKG: 10/17/20 Rate 44 bpm  Marked sinus bradycardia  Nonspecific T wave abnormality   CV:  Past Medical History:  Diagnosis Date   ATRIAL FIBRILLATION, PAROXYSMAL    BRADYCARDIA     Cancer ([place])    hx of thyroid cancer   Cataract    not a surgical candidate at this time (08/22/2020)   Congestive heart failure, unspecified    COPD    Depression    not on meds at this time   HYPERTENSION    HYPOTHYROIDISM    on meds   OSTEOARTHRITIS    bilateral knees   Seasonal allergies     Past Surgical History:  Procedure Laterality Date   THYROIDECTOMY     VAGINAL HYSTERECTOMY     WISDOM TOOTH EXTRACTION      MEDICATIONS:  acetaminophen (TYLENOL) 500 MG tablet   aspirin EC 81 MG tablet   brimonidine (ALPHAGAN) 0.2 % ophthalmic solution   Calcium Carbonate (CALTRATE 600 PO)   citalopram (CELEXA) 20 MG tablet   fluticasone (FLONASE) 50 MCG/ACT nasal spray   gabapentin (NEURONTIN) 800 MG tablet   levothyroxine (SYNTHROID) 75 MCG tablet   montelukast (SINGULAIR) 10 MG tablet   Multiple Vitamin (MULTIVITAMIN WITH MINERALS) TABS tablet   spironolactone (ALDACTONE) 25 MG tablet   Travoprost, BAK Free, (TRAVATAMN) 0.004 % SOLN ophthalmic solution   No current facility-administered medications for this encounter.     Konrad Felix Ward, PA-C [facility] Pre-Surgical Testing [phone]

## 2020-11-22 NOTE — Anesthesia Preprocedure Evaluation (Addendum)
Anesthesia Evaluation  Patient identified by MRN, date of birth, ID band Patient awake    Reviewed: Allergy & Precautions, H&P , NPO status , Patient's Chart, lab work & pertinent test results  Airway Mallampati: II  TM Distance: >3 FB Neck ROM: Full    Dental no notable dental hx. (+) Edentulous Upper, Edentulous Lower, Dental Advisory Given   Pulmonary COPD, former smoker,    Pulmonary exam normal breath sounds clear to auscultation       Cardiovascular Exercise Tolerance: Good hypertension, +CHF   Rhythm:Regular Rate:Normal     Neuro/Psych Depression negative neurological ROS     GI/Hepatic Neg liver ROS, GERD  ,  Endo/Other  Hypothyroidism   Renal/GU negative Renal ROS  negative genitourinary   Musculoskeletal  (+) Arthritis , Osteoarthritis,    Abdominal   Peds  Hematology negative hematology ROS (+)   Anesthesia Other Findings   Reproductive/Obstetrics negative OB ROS                           Anesthesia Physical Anesthesia Plan  ASA: 3  Anesthesia Plan: Spinal   Post-op Pain Management:  Regional for Post-op pain   Induction: Intravenous  PONV Risk Score and Plan: 3 and Propofol infusion, Ondansetron and Dexamethasone  Airway Management Planned: Simple Face Mask  Additional Equipment:   Intra-op Plan:   Post-operative Plan:   Informed Consent: I have reviewed the patients History and Physical, chart, labs and discussed the procedure including the risks, benefits and alternatives for the proposed anesthesia with the patient or authorized representative who has indicated his/her understanding and acceptance.     Dental advisory given  Plan Discussed with: CRNA  Anesthesia Plan Comments: (See PAT note 11/20/2020, Konrad Felix Ward, PA-C)      Anesthesia Quick Evaluation

## 2020-11-26 NOTE — Care Plan (Signed)
Ortho Bundle Case Management Note  Patient Details  Name: [name] [name] MRN: [id number] Date of Birth: [birth year]   Met with patient in the office prior to surgery. SHe will discharge to home with her daughter - [name] - [address] [place], - dtr cell number is [phone]. Rolling walker and CPM ordered for home use. OPPT set up at [facility]  Patient and MD in agreement with plan. Choice offered                   DME Arranged:  CPM, Walker rolling DME Agency:  [employer]  HH Arranged:    [place] Agency:     Additional Comments: Please contact me with any questions of if this plan should need to change.  Ladell Heads,  [place] Specialist  [phone] 11/26/2020, 8:55 AM

## 2020-11-27 NOTE — H&P (Signed)
KNEE ARTHROPLASTY ADMISSION H&P  Patient ID: AZILE MINARDI MRN: [id number] DOB/AGE: Jun 17, 1950 70 y.o.  Chief Complaint: left knee pain.  Planned Procedure Date: 12/03/20 Medical Clearance by Dr. Criss Rosales   Cardiac Clearance by Dr. Stanford Breed   HPI: [name] [name] is a 70 y.o. female who presents for evaluation of OA LEFT KNEE. The patient has a history of pain and functional disability in the left knee due to arthritis and has failed non-surgical conservative treatments for greater than 12 weeks to include NSAID's and/or analgesics, corticosteriod injections, use of assistive devices, and activity modification.  Onset of symptoms was gradual, starting 10 years ago with gradually worsening course since that time. The patient noted no past surgery on the left knee.  Patient currently rates pain at 8 out of 10 with activity. Patient has night pain, worsening of pain with activity and weight bearing, and pain that interferes with activities of daily living.  Patient has evidence of subchondral sclerosis, periarticular osteophytes, and joint space narrowing by imaging studies.  There is no active infection.  Past Medical History:  Diagnosis Date   ATRIAL FIBRILLATION, PAROXYSMAL    BRADYCARDIA    Cancer ([place])    hx of thyroid cancer   Cataract    not a surgical candidate at this time (08/22/2020)   Congestive heart failure, unspecified    COPD    Depression    not on meds at this time   HYPERTENSION    HYPOTHYROIDISM    on meds   OSTEOARTHRITIS    bilateral knees   Seasonal allergies    Past Surgical History:  Procedure Laterality Date   THYROIDECTOMY     VAGINAL HYSTERECTOMY     WISDOM TOOTH EXTRACTION     Allergies  Allergen Reactions   Penicillins Nausea And Vomiting   Prior to Admission medications   Medication Sig Start Date End Date Taking? Authorizing Provider  acetaminophen (TYLENOL) 500 MG tablet Take 500 mg by mouth every 6 (six) hours as needed for pain.   Yes  [provider]  aspirin EC 81 MG tablet Take 81 mg by mouth daily.   Yes [provider]  brimonidine (ALPHAGAN) 0.2 % ophthalmic solution Place 1 drop into both eyes 3 (three) times daily. 08/07/20  Yes [provider]  citalopram (CELEXA) 20 MG tablet Take 20 mg by mouth daily. 06/02/20  Yes [provider]  fluticasone (FLONASE) 50 MCG/ACT nasal spray Place 1 spray into both nostrils daily. 10/10/20  Yes [provider]  gabapentin (NEURONTIN) 800 MG tablet Take 800-1,600 tablets by mouth See admin instructions. Take 1 tablet (800 mg) by mouth in the morning & take 2 tablets (1600 mg) by mouth at bedtime. 08/06/20  Yes [provider]  levothyroxine (SYNTHROID) 75 MCG tablet Take 75 mcg by mouth every morning. 09/28/19  Yes [provider]  montelukast (SINGULAIR) 10 MG tablet Take 10 mg by mouth in the morning.   Yes [provider]  spironolactone (ALDACTONE) 25 MG tablet Take 25 mg by mouth every morning. 08/09/20  Yes [provider]  Travoprost, BAK Free, (TRAVATAMN) 0.004 % SOLN ophthalmic solution Place 1 drop into both eyes at bedtime.     Yes [provider]  Calcium Carbonate (CALTRATE 600 PO) Take 1 tablet by mouth daily.     [provider]  Multiple Vitamin (MULTIVITAMIN WITH MINERALS) TABS tablet Take 1 tablet by mouth daily. Centrum Silver for Women    [provider]  Social History   Socioeconomic History   Marital status: Single    Spouse name: Not on file   Number of children: Not on file   Years of education: Not on file   Highest education level: Not on file  Occupational History   Not on file  Tobacco Use   Smoking status: Former    Types: Cigarettes   Smokeless tobacco: Never  Vaping Use   Vaping Use: Never used  Substance and Sexual Activity   Alcohol use: Never   Drug use: Never   Sexual activity: Not on file  Other Topics Concern   Not on file  Social  History Narrative   Not on file   Social Determinants of Health   Financial Resource Strain: Not on file  Food Insecurity: Not on file  Transportation Needs: Not on file  Physical Activity: Not on file  Stress: Not on file  Social Connections: Not on file   Family History  Problem Relation Age of Onset   Colon polyps Neg Hx    Colon cancer Neg Hx    Esophageal cancer Neg Hx    Stomach cancer Neg Hx    Rectal cancer Neg Hx     ROS: Currently denies lightheadedness, dizziness, Fever, chills, CP, SOB.   No personal history of DVT, PE, MI, or CVA. Full dentures.  All other systems have been reviewed and were otherwise currently negative with the exception of those mentioned in the HPI and as above.  Objective: Vitals: Ht: 5'4" Wt: 158.6 lbs Temp: 98.0 BP: 133/82 Pulse: 57 O2 96% on room air.   Physical Exam: General: Alert, NAD.  Antalgic Gait  HEENT: EOMI, Good Neck Extension  Pulm: No increased work of breathing.  Clear B/L A/P w/o crackle or wheeze.  CV: RRR, No m/g/r appreciated  GI: soft, NT, ND. BS x 4 quadrants Neuro: CN II-XII grossly intact without focal deficit.  Sensation intact distally Skin: No lesions in the area of chief complaint MSK/Surgical Site: left knee w/o redness or effusion.  No JLT. ROM 0-120.  5/5 strength in extension and flexion.  +EHL/FHL.  NVI.  Stable varus and valgus stress.    Imaging Review Plain radiographs demonstrate moderate degenerative joint disease of the left knee.   The overall alignment isneutral. The bone quality appears to be fair for age and reported activity level.  Preoperative templating of the joint replacement has been completed, documented, and submitted to the Operating Room personnel in order to optimize intra-operative equipment management.  Assessment: OA LEFT KNEE Active Problems:   * No active hospital problems. *   Plan: Plan for Procedure(s): TOTAL KNEE ARTHROPLASTY  The patient history, physical exam,  clinical judgement of the provider and imaging are consistent with end stage degenerative joint disease and total joint arthroplasty is deemed medically necessary. The treatment options including medical management, injection therapy, and arthroplasty were discussed at length. The risks and benefits of Procedure(s): TOTAL KNEE ARTHROPLASTY were presented and reviewed.  The risks of nonoperative treatment, versus surgical intervention including but not limited to continued pain, aseptic loosening, stiffness, dislocation/subluxation, infection, bleeding, nerve injury, blood clots, cardiopulmonary complications, morbidity, mortality, among others were discussed. The patient verbalizes understanding and wishes to proceed with the plan.  Patient is being admitted for inpatient treatment for surgery, pain control, PT, prophylactic antibiotics, VTE prophylaxis, progressive ambulation, ADL's and discharge planning.   Dental prophylaxis discussed and recommended for 2 years postoperatively.  The patient does meet the criteria for  TXA which will be used perioperatively.   ASA 81 mg BID will be used postoperatively for DVT prophylaxis in addition to SCDs, and early ambulation. Plan for Tylenol, Celebrex, Norco 10 for pain.   Robaxin for muscle spasms.   Zofran for nausea and vomiting. Omeprazole for gastric protection. Pharmacy- [facility] on [address] The patient is planning to be discharged home with OPPT and into the care of her daughter Levada Dy who can be reached at [phone] or [phone]. Follow up appt 12/18/20 at 4:15pm     Alisa Graff Office [phone] 11/27/2020 12:29 PM

## 2020-12-03 ENCOUNTER — Ambulatory Visit (HOSPITAL_COMMUNITY): Payer: Medicare Other | Admitting: Physician Assistant

## 2020-12-03 ENCOUNTER — Encounter (HOSPITAL_COMMUNITY)
Admission: RE | Disposition: A | Discharge status: Home / Self Care | Payer: Self-pay | Source: Home / Self Care | Attending: Orthopedic Surgery

## 2020-12-03 ENCOUNTER — Encounter (HOSPITAL_COMMUNITY): Payer: Self-pay | Admitting: Orthopedic Surgery

## 2020-12-03 ENCOUNTER — Ambulatory Visit: Payer: Medicare Other | Admitting: General Practice

## 2020-12-03 ENCOUNTER — Ambulatory Visit (HOSPITAL_COMMUNITY): Payer: Medicare Other | Admitting: Anesthesiology

## 2020-12-03 ENCOUNTER — Ambulatory Visit (HOSPITAL_COMMUNITY)
Admission: RE | Admit: 2020-12-03 | Discharge: 2020-12-03 | Disposition: A | Discharge status: Home / Self Care | Payer: Medicare Other | Attending: Orthopedic Surgery | Admitting: Orthopedic Surgery

## 2020-12-03 ENCOUNTER — Ambulatory Visit (HOSPITAL_COMMUNITY): Payer: Medicare Other

## 2020-12-03 DIAGNOSIS — Z79890 Hormone replacement therapy (postmenopausal): Secondary | ICD-10-CM | POA: Diagnosis not present

## 2020-12-03 DIAGNOSIS — I110 Hypertensive heart disease with heart failure: Secondary | ICD-10-CM | POA: Insufficient documentation

## 2020-12-03 DIAGNOSIS — M1712 Unilateral primary osteoarthritis, left knee: Secondary | ICD-10-CM | POA: Diagnosis not present

## 2020-12-03 DIAGNOSIS — K219 Gastro-esophageal reflux disease without esophagitis: Secondary | ICD-10-CM | POA: Diagnosis not present

## 2020-12-03 DIAGNOSIS — Z79899 Other long term (current) drug therapy: Secondary | ICD-10-CM | POA: Insufficient documentation

## 2020-12-03 DIAGNOSIS — E039 Hypothyroidism, unspecified: Secondary | ICD-10-CM | POA: Insufficient documentation

## 2020-12-03 DIAGNOSIS — J449 Chronic obstructive pulmonary disease, unspecified: Secondary | ICD-10-CM | POA: Insufficient documentation

## 2020-12-03 DIAGNOSIS — Z7982 Long term (current) use of aspirin: Secondary | ICD-10-CM | POA: Insufficient documentation

## 2020-12-03 DIAGNOSIS — Z87891 Personal history of nicotine dependence: Secondary | ICD-10-CM | POA: Diagnosis not present

## 2020-12-03 DIAGNOSIS — G8918 Other acute postprocedural pain: Secondary | ICD-10-CM | POA: Diagnosis not present

## 2020-12-03 DIAGNOSIS — Z880 Allergy status to penicillin: Secondary | ICD-10-CM | POA: Insufficient documentation

## 2020-12-03 DIAGNOSIS — I480 Paroxysmal atrial fibrillation: Secondary | ICD-10-CM | POA: Insufficient documentation

## 2020-12-03 DIAGNOSIS — Z96652 Presence of left artificial knee joint: Secondary | ICD-10-CM | POA: Diagnosis not present

## 2020-12-03 DIAGNOSIS — I509 Heart failure, unspecified: Secondary | ICD-10-CM | POA: Insufficient documentation

## 2020-12-03 HISTORY — PX: TOTAL KNEE ARTHROPLASTY: SHX125

## 2020-12-03 SURGERY — ARTHROPLASTY, KNEE, TOTAL
Anesthesia: Spinal | Site: Knee | Laterality: Left

## 2020-12-03 MED ORDER — METOCLOPRAMIDE HCL 5 MG PO TABS
5.0000 mg | ORAL_TABLET | Freq: Three times a day (TID) | ORAL | Status: DC | PRN
  Filled 2020-12-03: qty 2

## 2020-12-03 MED ORDER — OMEPRAZOLE MAGNESIUM 20 MG PO TBEC: 20.0000 mg | DELAYED_RELEASE_TABLET | Freq: Every day | ORAL | 0 refills | Status: DC

## 2020-12-03 MED ORDER — CHLORHEXIDINE GLUCONATE 0.12 % MT SOLN
15.0000 mL | Freq: Once | OROMUCOSAL | Status: AC
  Administered 2020-12-03: 15 mL via OROMUCOSAL

## 2020-12-03 MED ORDER — TRAMADOL HCL 50 MG PO TABS: 50.0000 mg | ORAL_TABLET | Freq: Four times a day (QID) | ORAL | Status: DC

## 2020-12-03 MED ORDER — FENTANYL CITRATE (PF) 100 MCG/2ML IJ SOLN
INTRAMUSCULAR | Status: DC | PRN
  Administered 2020-12-03 (×2): 50 ug via INTRAVENOUS

## 2020-12-03 MED ORDER — ONDANSETRON HCL 4 MG PO TABS
4.0000 mg | ORAL_TABLET | Freq: Four times a day (QID) | ORAL | Status: DC | PRN
  Filled 2020-12-03: qty 1

## 2020-12-03 MED ORDER — LACTATED RINGERS IV BOLUS
500.0000 mL | Freq: Once | INTRAVENOUS | Status: AC
  Administered 2020-12-03: 500 mL via INTRAVENOUS

## 2020-12-03 MED ORDER — MORPHINE SULFATE (PF) 4 MG/ML IV SOLN: 0.5000 mg | INTRAVENOUS | Status: DC | PRN

## 2020-12-03 MED ORDER — POVIDONE-IODINE 10 % EX SWAB
2.0000 "application " | Freq: Once | CUTANEOUS | Status: AC
  Administered 2020-12-03: 2 via TOPICAL

## 2020-12-03 MED ORDER — CEFAZOLIN SODIUM-DEXTROSE 2-4 GM/100ML-% IV SOLN: 2.0000 g | Freq: Once | INTRAVENOUS | Status: DC

## 2020-12-03 MED ORDER — LACTATED RINGERS IV BOLUS
250.0000 mL | Freq: Once | INTRAVENOUS | Status: AC
  Administered 2020-12-03: 250 mL via INTRAVENOUS

## 2020-12-03 MED ORDER — PROPOFOL 10 MG/ML IV BOLUS
INTRAVENOUS | Status: DC | PRN
  Administered 2020-12-03 (×3): 10 mg via INTRAVENOUS

## 2020-12-03 MED ORDER — TRANEXAMIC ACID-NACL 1000-0.7 MG/100ML-% IV SOLN
1000.0000 mg | INTRAVENOUS | Status: AC
  Administered 2020-12-03: 1000 mg via INTRAVENOUS
  Filled 2020-12-03: qty 100

## 2020-12-03 MED ORDER — ONDANSETRON HCL 4 MG/2ML IJ SOLN: 4.0000 mg | Freq: Four times a day (QID) | INTRAMUSCULAR | Status: DC | PRN

## 2020-12-03 MED ORDER — HYDROCODONE-ACETAMINOPHEN 10-325 MG PO TABS: 1.0000 | ORAL_TABLET | Freq: Four times a day (QID) | ORAL | 0 refills | Status: DC | PRN

## 2020-12-03 MED ORDER — SODIUM CHLORIDE 0.9 % IR SOLN
Status: DC | PRN
  Administered 2020-12-03: 1000 mL

## 2020-12-03 MED ORDER — MIDAZOLAM HCL 2 MG/2ML IJ SOLN
INTRAMUSCULAR | Status: AC
  Filled 2020-12-03: qty 2

## 2020-12-03 MED ORDER — EPHEDRINE SULFATE-NACL 50-0.9 MG/10ML-% IV SOSY
PREFILLED_SYRINGE | INTRAVENOUS | Status: DC | PRN
  Administered 2020-12-03 (×2): 5 mg via INTRAVENOUS

## 2020-12-03 MED ORDER — 0.9 % SODIUM CHLORIDE (POUR BTL) OPTIME
TOPICAL | Status: DC | PRN
  Administered 2020-12-03: 1000 mL

## 2020-12-03 MED ORDER — BUPIVACAINE LIPOSOME 1.3 % IJ SUSP
INTRAMUSCULAR | Status: AC
  Filled 2020-12-03: qty 10

## 2020-12-03 MED ORDER — CELECOXIB 100 MG PO CAPS: 100.0000 mg | ORAL_CAPSULE | Freq: Two times a day (BID) | ORAL | 0 refills | Status: AC

## 2020-12-03 MED ORDER — BUPIVACAINE-EPINEPHRINE (PF) 0.25% -1:200000 IJ SOLN
INTRAMUSCULAR | Status: AC
  Filled 2020-12-03: qty 30

## 2020-12-03 MED ORDER — SODIUM CHLORIDE 0.9% FLUSH
INTRAVENOUS | Status: DC | PRN
  Administered 2020-12-03: 30 mL

## 2020-12-03 MED ORDER — ACETAMINOPHEN 500 MG PO TABS
1000.0000 mg | ORAL_TABLET | Freq: Three times a day (TID) | ORAL | 0 refills | Status: AC | PRN
Start: 2020-12-03 — End: ?

## 2020-12-03 MED ORDER — METHOCARBAMOL 500 MG IVPB - SIMPLE MED: 500.0000 mg | Freq: Four times a day (QID) | INTRAVENOUS | Status: DC | PRN

## 2020-12-03 MED ORDER — MIDAZOLAM HCL 5 MG/5ML IJ SOLN
INTRAMUSCULAR | Status: DC | PRN
  Administered 2020-12-03 (×2): 1 mg via INTRAVENOUS

## 2020-12-03 MED ORDER — ASPIRIN EC 81 MG PO TBEC: 81.0000 mg | DELAYED_RELEASE_TABLET | Freq: Two times a day (BID) | ORAL | 0 refills | Status: AC

## 2020-12-03 MED ORDER — FENTANYL CITRATE (PF) 100 MCG/2ML IJ SOLN
INTRAMUSCULAR | Status: AC
  Filled 2020-12-03: qty 2

## 2020-12-03 MED ORDER — ONDANSETRON HCL 4 MG/2ML IJ SOLN
INTRAMUSCULAR | Status: AC
  Filled 2020-12-03: qty 2

## 2020-12-03 MED ORDER — CEFAZOLIN SODIUM-DEXTROSE 2-3 GM-%(50ML) IV SOLR
INTRAVENOUS | Status: DC | PRN
  Administered 2020-12-03: 2 g via INTRAVENOUS

## 2020-12-03 MED ORDER — DEXAMETHASONE SODIUM PHOSPHATE 10 MG/ML IJ SOLN
INTRAMUSCULAR | Status: AC
  Filled 2020-12-03: qty 1

## 2020-12-03 MED ORDER — WATER FOR IRRIGATION, STERILE IR SOLN
Status: DC | PRN
  Administered 2020-12-03: 2000 mL

## 2020-12-03 MED ORDER — METHOCARBAMOL 750 MG PO TABS: 750.0000 mg | ORAL_TABLET | Freq: Three times a day (TID) | ORAL | 0 refills | Status: DC | PRN

## 2020-12-03 MED ORDER — ONDANSETRON HCL 4 MG/2ML IJ SOLN
INTRAMUSCULAR | Status: DC | PRN
  Administered 2020-12-03: 4 mg via INTRAVENOUS

## 2020-12-03 MED ORDER — ORAL CARE MOUTH RINSE: 15.0000 mL | Freq: Once | OROMUCOSAL | Status: AC

## 2020-12-03 MED ORDER — CEFAZOLIN SODIUM-DEXTROSE 1-4 GM/50ML-% IV SOLN
1.0000 g | Freq: Four times a day (QID) | INTRAVENOUS | Status: DC
Start: 2020-12-03 — End: 2020-12-03

## 2020-12-03 MED ORDER — ONDANSETRON 4 MG PO TBDP: 4.0000 mg | ORAL_TABLET | Freq: Two times a day (BID) | ORAL | 0 refills | Status: DC | PRN

## 2020-12-03 MED ORDER — DEXAMETHASONE SODIUM PHOSPHATE 10 MG/ML IJ SOLN
8.0000 mg | Freq: Once | INTRAMUSCULAR | Status: AC
  Administered 2020-12-03: 8 mg via INTRAVENOUS

## 2020-12-03 MED ORDER — ACETAMINOPHEN 500 MG PO TABS
1000.0000 mg | ORAL_TABLET | Freq: Once | ORAL | Status: AC
  Administered 2020-12-03: 1000 mg via ORAL
  Filled 2020-12-03: qty 2

## 2020-12-03 MED ORDER — BUPIVACAINE IN DEXTROSE 0.75-8.25 % IT SOLN
INTRATHECAL | Status: DC | PRN
  Administered 2020-12-03: 1.6 mL via INTRATHECAL

## 2020-12-03 MED ORDER — VANCOMYCIN HCL IN DEXTROSE 1-5 GM/200ML-% IV SOLN
1000.0000 mg | INTRAVENOUS | Status: DC
  Filled 2020-12-03: qty 200

## 2020-12-03 MED ORDER — POVIDONE-IODINE 10 % EX SWAB: 2.0000 "application " | Freq: Once | CUTANEOUS | Status: DC

## 2020-12-03 MED ORDER — METHOCARBAMOL 500 MG PO TABS
ORAL_TABLET | ORAL | Status: AC
  Administered 2020-12-03: 500 mg via ORAL
  Filled 2020-12-03: qty 1

## 2020-12-03 MED ORDER — HYDROCODONE-ACETAMINOPHEN 5-325 MG PO TABS: 1.0000 | ORAL_TABLET | ORAL | Status: DC | PRN

## 2020-12-03 MED ORDER — BUPIVACAINE-EPINEPHRINE (PF) 0.5% -1:200000 IJ SOLN
INTRAMUSCULAR | Status: DC | PRN
  Administered 2020-12-03: 20 mL via PERINEURAL

## 2020-12-03 MED ORDER — PROPOFOL 1000 MG/100ML IV EMUL
INTRAVENOUS | Status: AC
  Filled 2020-12-03: qty 100

## 2020-12-03 MED ORDER — ACETAMINOPHEN 500 MG PO TABS: 500.0000 mg | ORAL_TABLET | Freq: Four times a day (QID) | ORAL | Status: DC

## 2020-12-03 MED ORDER — METHOCARBAMOL 500 MG PO TABS: 500.0000 mg | ORAL_TABLET | Freq: Four times a day (QID) | ORAL | Status: DC | PRN

## 2020-12-03 MED ORDER — HYDROCODONE-ACETAMINOPHEN 5-325 MG PO TABS
ORAL_TABLET | ORAL | Status: AC
  Administered 2020-12-03: 2 via ORAL
  Filled 2020-12-03: qty 2

## 2020-12-03 MED ORDER — BUPIVACAINE LIPOSOME 1.3 % IJ SUSP
INTRAMUSCULAR | Status: DC | PRN
  Administered 2020-12-03: 20 mL

## 2020-12-03 MED ORDER — BUPIVACAINE-EPINEPHRINE 0.25% -1:200000 IJ SOLN
INTRAMUSCULAR | Status: DC | PRN
  Administered 2020-12-03: 30 mL

## 2020-12-03 MED ORDER — ACETAMINOPHEN 325 MG PO TABS: 325.0000 mg | ORAL_TABLET | Freq: Four times a day (QID) | ORAL | Status: DC | PRN

## 2020-12-03 MED ORDER — BUPIVACAINE LIPOSOME 1.3 % IJ SUSP: 20.0000 mL | Freq: Once | INTRAMUSCULAR | Status: DC

## 2020-12-03 MED ORDER — FENTANYL CITRATE PF 50 MCG/ML IJ SOSY: 25.0000 ug | PREFILLED_SYRINGE | INTRAMUSCULAR | Status: DC | PRN

## 2020-12-03 MED ORDER — EPHEDRINE 5 MG/ML INJ
INTRAVENOUS | Status: AC
  Filled 2020-12-03: qty 5

## 2020-12-03 MED ORDER — PHENYLEPHRINE HCL-NACL 20-0.9 MG/250ML-% IV SOLN
INTRAVENOUS | Status: DC | PRN
  Administered 2020-12-03: 35 ug/min via INTRAVENOUS

## 2020-12-03 MED ORDER — METOCLOPRAMIDE HCL 5 MG/ML IJ SOLN: 5.0000 mg | Freq: Three times a day (TID) | INTRAMUSCULAR | Status: DC | PRN

## 2020-12-03 MED ORDER — ACETAMINOPHEN 500 MG PO TABS: 1000.0000 mg | ORAL_TABLET | Freq: Once | ORAL | Status: DC

## 2020-12-03 MED ORDER — TRANEXAMIC ACID-NACL 1000-0.7 MG/100ML-% IV SOLN
1000.0000 mg | Freq: Once | INTRAVENOUS | Status: AC
  Administered 2020-12-03: 1000 mg via INTRAVENOUS

## 2020-12-03 MED ORDER — HYDROCODONE-ACETAMINOPHEN 7.5-325 MG PO TABS: 1.0000 | ORAL_TABLET | ORAL | Status: DC | PRN

## 2020-12-03 MED ORDER — LACTATED RINGERS IV SOLN: INTRAVENOUS | Status: DC

## 2020-12-03 MED ORDER — PROPOFOL 500 MG/50ML IV EMUL
INTRAVENOUS | Status: DC | PRN
  Administered 2020-12-03: 75 ug/kg/min via INTRAVENOUS

## 2020-12-03 SURGICAL SUPPLY — 58 items
BAG COUNTER SPONGE SURGICOUNT (BAG) IMPLANT
BAG SPNG CNTER NS LX DISP (BAG)
BLADE HEX COATED 2.75 (ELECTRODE) ×2 IMPLANT
BLADE SAG 18X100X1.27 (BLADE) ×2 IMPLANT
BLADE SAGITTAL 25.0X1.37X90 (BLADE) ×2 IMPLANT
BLADE SURG 15 STRL LF DISP TIS (BLADE) ×1 IMPLANT
BLADE SURG 15 STRL SS (BLADE) ×2
BLADE SURG SZ10 CARB STEEL (BLADE) ×4 IMPLANT
BNDG CMPR MED 10X6 ELC LF (GAUZE/BANDAGES/DRESSINGS) ×1
BNDG CMPR MED 15X6 ELC VLCR LF (GAUZE/BANDAGES/DRESSINGS) ×1
BNDG ELASTIC 6X10 VLCR STRL LF (GAUZE/BANDAGES/DRESSINGS) ×2 IMPLANT
BNDG ELASTIC 6X15 VLCR STRL LF (GAUZE/BANDAGES/DRESSINGS) ×1 IMPLANT
BOWL SMART MIX CTS (DISPOSABLE) IMPLANT
BSPLAT TIB 4 KN TRITANIUM (Knees) ×1 IMPLANT
CLSR STERI-STRIP ANTIMIC 1/2X4 (GAUZE/BANDAGES/DRESSINGS) ×2 IMPLANT
COVER SURGICAL LIGHT HANDLE (MISCELLANEOUS) ×2 IMPLANT
CUFF TOURN SGL QUICK 34 (TOURNIQUET CUFF) ×2
CUFF TRNQT CYL 34X4.125X (TOURNIQUET CUFF) ×1 IMPLANT
DECANTER SPIKE VIAL GLASS SM (MISCELLANEOUS) ×2 IMPLANT
DRAPE INCISE IOBAN 66X45 STRL (DRAPES) ×6 IMPLANT
DRAPE U-SHAPE 47X51 STRL (DRAPES) ×2 IMPLANT
DRSG MEPILEX BORDER 4X12 (GAUZE/BANDAGES/DRESSINGS) ×2 IMPLANT
DRSG MEPILEX SACRM 8.7X9.8 (GAUZE/BANDAGES/DRESSINGS) ×1 IMPLANT
DURAPREP 26ML APPLICATOR (WOUND CARE) ×4 IMPLANT
FEMORAL RETAINING CRUC KNEE #4 (Knees) ×1 IMPLANT
GLOVE SRG 8 PF TXTR STRL LF DI (GLOVE) ×1 IMPLANT
GLOVE SURG ENC MOIS LTX SZ7.5 (GLOVE) ×2 IMPLANT
GLOVE SURG POLYISO LF SZ7.5 (GLOVE) ×2 IMPLANT
GLOVE SURG UNDER POLY LF SZ7.5 (GLOVE) ×2 IMPLANT
GLOVE SURG UNDER POLY LF SZ8 (GLOVE) ×2
GOWN STRL REUS W/TWL LRG LVL3 (GOWN DISPOSABLE) ×2 IMPLANT
GOWN STRL REUS W/TWL XL LVL3 (GOWN DISPOSABLE) ×2 IMPLANT
HANDPIECE INTERPULSE COAX TIP (DISPOSABLE) ×2
HOLDER FOLEY CATH W/STRAP (MISCELLANEOUS) IMPLANT
IMMOBILIZER KNEE 20 (SOFTGOODS) ×2
IMMOBILIZER KNEE 20 THIGH 36 (SOFTGOODS) IMPLANT
IMMOBILIZER KNEE 22 UNIV (SOFTGOODS) ×2 IMPLANT
INSERT TIB BEARING X3 SZ4 11 (Joint) ×1 IMPLANT
KIT TURNOVER KIT A (KITS) ×2 IMPLANT
KNEE PATELLA ASYMMETRIC 10X32 (Knees) ×1 IMPLANT
KNEE TIBIAL COMP TRI SZ4 (Knees) ×1 IMPLANT
MANIFOLD NEPTUNE II (INSTRUMENTS) ×2 IMPLANT
NS IRRIG 1000ML POUR BTL (IV SOLUTION) ×2 IMPLANT
PACK ICE MAXI GEL EZY WRAP (MISCELLANEOUS) ×2 IMPLANT
PACK TOTAL KNEE CUSTOM (KITS) ×2 IMPLANT
PIN FLUTED HEDLESS FIX 3.5X1/8 (PIN) ×1 IMPLANT
PROTECTOR NERVE ULNAR (MISCELLANEOUS) ×2 IMPLANT
SET HNDPC FAN SPRY TIP SCT (DISPOSABLE) ×1 IMPLANT
SUT MNCRL AB 3-0 PS2 18 (SUTURE) ×2 IMPLANT
SUT VIC AB 0 CT1 36 (SUTURE) ×2 IMPLANT
SUT VIC AB 1 CT1 36 (SUTURE) ×4 IMPLANT
SUT VIC AB 2-0 CT1 27 (SUTURE) ×2
SUT VIC AB 2-0 CT1 TAPERPNT 27 (SUTURE) ×1 IMPLANT
TAPE STRIPS DRAPE STRL (GAUZE/BANDAGES/DRESSINGS) ×1 IMPLANT
TRAY FOLEY MTR SLVR 14FR STAT (SET/KITS/TRAYS/PACK) IMPLANT
TRAY FOLEY MTR SLVR 16FR STAT (SET/KITS/TRAYS/PACK) IMPLANT
TUBE SUCTION HIGH CAP CLEAR NV (SUCTIONS) ×2 IMPLANT
WRAP KNEE MAXI GEL POST OP (GAUZE/BANDAGES/DRESSINGS) ×1 IMPLANT

## 2020-12-03 NOTE — Anesthesia Procedure Notes (Signed)
Spinal  Patient location during procedure: OR Start time: 12/03/2020 7:20 AM End time: 12/03/2020 7:25 AM Reason for block: surgical anesthesia Staffing Performed: anesthesiologist  Anesthesiologist: Roderic Palau, MD Preanesthetic Checklist Completed: patient identified, IV checked, risks and benefits discussed, surgical consent, monitors and equipment checked, pre-op evaluation and timeout performed Spinal Block Patient position: sitting Prep: DuraPrep Patient monitoring: cardiac monitor, continuous pulse ox and blood pressure Approach: right paramedian Location: L3-4 Injection technique: single-shot Needle Needle type: Quincke (First attempt with Pencan 24G. Unable to locate space.)  Needle gauge: 22 G Needle length: 9 cm Assessment Sensory level: T8 Events: CSF return and second provider Additional Notes Functioning IV was confirmed and monitors were applied. Sterile prep and drape, including hand hygiene and sterile gloves were used. The patient was positioned and the spine was prepped. The skin was anesthetized with lidocaine.  Free flow of clear CSF was obtained prior to injecting local anesthetic into the CSF.  The spinal needle aspirated freely following injection.  The needle was carefully withdrawn.  The patient tolerated the procedure well.

## 2020-12-03 NOTE — Anesthesia Postprocedure Evaluation (Signed)
Anesthesia Post Note  Patient: [name] [name]  Procedure(s) Performed: TOTAL KNEE ARTHROPLASTY (Left: Knee)     Patient location during evaluation: PACU Anesthesia Type: Spinal and Regional Level of consciousness: oriented and awake and alert Pain management: pain level controlled Vital Signs Assessment: post-procedure vital signs reviewed and stable Respiratory status: spontaneous breathing and respiratory function stable Cardiovascular status: blood pressure returned to baseline and stable Postop Assessment: no headache, no backache, no apparent nausea or vomiting, spinal receding and patient able to bend at knees Anesthetic complications: no   No notable events documented.  Last Vitals:  Vitals:   12/03/20 1000 12/03/20 1015  BP: (!) 146/89 (!) 146/90  Pulse: 76 80  Resp: 15 17  Temp:  36.5 C  SpO2: 96% 95%    Last Pain:  Vitals:   12/03/20 1015  TempSrc:   PainSc: 0-No pain                 Geral Tuch,W. EDMOND

## 2020-12-03 NOTE — Interval H&P Note (Signed)
History and Physical Interval Note:  12/03/2020 7:04 AM  [name] [name]  has presented today for surgery, with the diagnosis of OA LEFT KNEE.  The various methods of treatment have been discussed with the patient and family. After consideration of risks, benefits and other options for treatment, the patient has consented to  Procedure(s): TOTAL KNEE ARTHROPLASTY (Left) as a surgical intervention.  The patient's history has been reviewed, patient examined, no change in status, stable for surgery.  I have reviewed the patient's chart and labs.  Questions were answered to the patient's satisfaction.     Renette Butters

## 2020-12-03 NOTE — Evaluation (Signed)
Physical Therapy Evaluation Patient Details Name: [name] [name] MRN: [id number] DOB: 1950/05/25 Today's Date: 12/03/2020  History of Present Illness  Patient is 70 y.o. female s/p Lt TKA on 12/03/20 with PMH significant for Afib, bradycardia, thyroid cancer, COPD, HTN, hypothyroidism, OA, depression.    Clinical Impression  [name] [name] is a 70 y.o. female POD 0 s/p Lt TKA. Patient reports independence with mobility at baseline. Patient is now limited by functional impairments (see PT problem list below) and requires min guard/supervision for transfers and gait with RW. Patient was able to ambulate ~90 feet with RW and min guard/supervision and cues for safe walker management. Patient educated on safe sequencing for stair mobility and verbalized safe guarding position for people assisting with mobility. Patient instructed in exercises to facilitate ROM and circulation. Patient will benefit from continued skilled PT interventions to address impairments and progress towards PLOF. Patient has met mobility goals at adequate level for discharge home; will continue to follow if pt continues acute stay to progress towards Mod I goals.        Recommendations for follow up therapy are one component of a multi-disciplinary discharge planning process, led by the attending physician.  Recommendations may be updated based on patient status, additional functional criteria and insurance authorization.  Follow Up Recommendations Follow surgeon's recommendation for DC plan and follow-up therapies    Equipment Recommendations  None recommended by PT    Recommendations for Other Services       Precautions / Restrictions Precautions Precautions: Fall Restrictions Weight Bearing Restrictions: No Other Position/Activity Restrictions: WBAT      Mobility  Bed Mobility Overal bed mobility: Needs Assistance Bed Mobility: Supine to Sit     Supine to sit: Min guard;Supervision;HOB elevated     General  bed mobility comments: HOB slightly elevated, pt taking extra time but able to bring Bil LE's off EOB to sit up.    Transfers Overall transfer level: Needs assistance Equipment used: Rolling walker (2 wheeled) Transfers: Sit to/from Stand Sit to Stand: Min guard;Supervision         General transfer comment: cues for safe hand placement for power up, guard/sup for safety. cues for safe reach back and to extend Lt knee when sitting.  Ambulation/Gait Ambulation/Gait assistance: Min guard;Supervision Gait Distance (Feet): 90 Feet Assistive device: Rolling walker (2 wheeled) Gait Pattern/deviations: Step-to pattern;Decreased stride length;Decreased weight shift to left Gait velocity: decr   General Gait Details: cues for step to pattern and position of walker, pt progressed to superivision throughout with improved pace and no overt LOB or buckling at Lt knee.  Stairs Stairs: Yes Stairs assistance: Min guard Stair Management: Two rails;Step to pattern;Forwards Number of Stairs: 3 General stair comments: cues for sequencing "up with good, down with bad" no LOB and pt with good use of UE's to support Lt knee. pt verbalized safe guarding/assist for family to provide.  Wheelchair Mobility    Modified Rankin (Stroke Patients Only)       Balance Overall balance assessment: Needs assistance Sitting-balance support: Feet supported Sitting balance-Leahy Scale: Good     Standing balance support: During functional activity;Bilateral upper extremity supported Standing balance-Leahy Scale: Fair                               Pertinent Vitals/Pain Pain Assessment: 0-10 Pain Score: 7  Pain Location: Lt knee Pain Descriptors / Indicators: Aching;Discomfort;Sore Pain Intervention(s): Limited activity within patient's tolerance;Monitored  during session;Repositioned    Home Living Family/patient expects to be discharged to:: Private residence Living Arrangements:  Alone Available Help at Discharge: Family Type of Home: House Home Access: Stairs to enter Entrance Stairs-Rails: Can reach both Entrance Stairs-Number of Steps: 2 Home Layout: One level Home Equipment: [place] - 2 wheels;Cane - single point Additional Comments: pt is going to DC to her daughters home where her daughter, son-in-law, grandson, and her own son will her as she recovers.    Prior Function Level of Independence: Independent with assistive device(s)         Comments: using RW for gait, just practicing with it. and using knee brace     Hand Dominance        Extremity/Trunk Assessment   Upper Extremity Assessment Upper Extremity Assessment: Overall WFL for tasks assessed    Lower Extremity Assessment Lower Extremity Assessment: LLE deficits/detail LLE Deficits / Details: good quad activation, no extensor lag with SLR LLE Sensation: WNL LLE Coordination: WNL    Cervical / Trunk Assessment Cervical / Trunk Assessment: Normal  Communication   Communication: No difficulties  Cognition Arousal/Alertness: Awake/alert Behavior During Therapy: WFL for tasks assessed/performed Overall Cognitive Status: Within Functional Limits for tasks assessed                                        General Comments      Exercises Total Joint Exercises Ankle Circles/Pumps: AROM;Both;Seated;15 reps Quad Sets: AROM;Left;Other reps (comment);Seated Short Arc Quad: AROM;Left;Other reps (comment);Seated Heel Slides: AROM;Left;Other reps (comment);Seated Hip ABduction/ADduction: AROM;Left;Other reps (comment);Seated Straight Leg Raises: AROM;Left;Other reps (comment);Seated   Assessment/Plan    PT Assessment Patient needs continued PT services  PT Problem List Decreased strength;Decreased range of motion;Decreased balance;Decreased activity tolerance;Decreased mobility;Decreased knowledge of use of DME;Decreased knowledge of precautions;Pain       PT  Treatment Interventions DME instruction;Gait training;Stair training;Functional mobility training;Therapeutic activities;Therapeutic exercise;Balance training;Patient/family education    PT Goals (Current goals can be found in the Care Plan section)  Acute Rehab PT Goals Patient Stated Goal: get home and start walking more PT Goal Formulation: With patient Time For Goal Achievement: 12/10/20 Potential to Achieve Goals: Good    Frequency 7X/week   Barriers to discharge        Co-evaluation               AM-PAC PT "6 Clicks" Mobility  Outcome Measure Help needed turning from your back to your side while in a flat bed without using bedrails?: A Little Help needed moving from lying on your back to sitting on the side of a flat bed without using bedrails?: A Little Help needed moving to and from a bed to a chair (including a wheelchair)?: A Little Help needed standing up from a chair using your arms (e.g., wheelchair or bedside chair)?: A Little Help needed to walk in hospital room?: A Little Help needed climbing 3-5 steps with a railing? : A Little 6 Click Score: 18    End of Session Equipment Utilized During Treatment: Gait belt Activity Tolerance: Patient tolerated treatment well Patient left: in chair;with call bell/phone within reach Nurse Communication: Mobility status PT Visit Diagnosis: Muscle weakness (generalized) (M62.81);Difficulty in walking, not elsewhere classified (R26.2)    Time: 5859-2924 PT Time Calculation (min) (ACUTE ONLY): 30 min   Charges:   PT Evaluation $PT Eval Low Complexity: 1 Low PT Treatments $Gait Training:  8-22 mins        Verner Mould, DPT Acute Rehabilitation Services Office [phone] Pager [phone]   Jacques Navy 12/03/2020, 2:21 PM

## 2020-12-03 NOTE — Op Note (Signed)
DATE OF SURGERY:  12/03/2020 TIME: 8:28 AM  PATIENT NAME:  [name] [name]   AGE: 70 y.o.    PRE-OPERATIVE DIAGNOSIS:  OA LEFT KNEE  POST-OPERATIVE DIAGNOSIS:  Same  PROCEDURE:  Procedure(s): TOTAL KNEE ARTHROPLASTY   SURGEON:  Renette Butters, MD   ASSISTANT:  Aggie Moats, PA-C, he was present and scrubbed throughout the case, critical for completion in a timely fashion, and for retraction, instrumentation, and closure.    OPERATIVE IMPLANTS: [employer]ker Triathlon CR. Press fit knee  Femur size 4, Tibia size 4, Patella size 32 3-peg oval button, with a 11 mm polyethylene insert.   PREOPERATIVE INDICATIONS:  [name] [name] is a 70 y.o. year old female with end stage bone on bone degenerative arthritis of the knee who failed conservative treatment, including injections, antiinflammatories, activity modification, and assistive devices, and had significant impairment of their activities of daily living, and elected for Total Knee Arthroplasty.   The risks, benefits, and alternatives were discussed at length including but not limited to the risks of infection, bleeding, nerve injury, stiffness, blood clots, the need for revision surgery, cardiopulmonary complications, among others, and they were willing to proceed.   OPERATIVE DESCRIPTION:  The patient was brought to the operative room and placed in a supine position.  General anesthesia was administered.  IV antibiotics were given.  The lower extremity was prepped and draped in the usual sterile fashion.  Time out was performed.  The leg was elevated and exsanguinated and the tourniquet was inflated.  Anterior approach was performed.  The patella was everted and osteophytes were removed.  The anterior horn of the medial and lateral meniscus was removed.   The distal femur was opened with the drill and the intramedullary distal femoral cutting jig was utilized, set at 5 degrees resecting 9 mm off the distal femur.  Care was taken to  protect the collateral ligaments.  The distal femoral sizing jig was applied, taking care to avoid notching.  Then the 4-in-1 cutting jig was applied and the anterior and posterior femur was cut, along with the chamfer cuts.  All posterior osteophytes were removed.  The flexion gap was then measured and was symmetric with the extension gap.  Then the extramedullary tibial cutting jig was utilized making the appropriate cut using the anterior tibial crest as a reference building in appropriate posterior slope.  Care was taken during the cut to protect the medial and collateral ligaments.  The proximal tibia was removed along with the posterior horns of the menisci.  The PCL was sacrificed.    The extensor gap was measured and was approximately 53mm.    I completed the distal femoral preparation using the appropriate jig to prepare the box.  The patella was then measured, and cut with the saw.    The proximal tibia sized and prepared accordingly with the reamer and the punch, and then all components were trialed with the above sized poly insert.  The knee was found to have excellent balance and full motion.    The above named components were then impacted into place and Poly tibial piece and patella were inserted.  I was very happy with his stability and ROM  I performed a periarticular injection with marcaine and toradol  The knee was easily taken through a range of motion and the patella tracked well and the knee irrigated copiously and the parapatellar and subcutaneous tissue closed with vicryl, and monocryl with steri strips for the skin.  The  incision was dressed with sterile gauze and the tourniquet released and the patient was awakened and returned to the PACU in stable and satisfactory condition.  There were no complications.  Total tourniquet time was roughly 60 minutes.   POSTOPERATIVE PLAN: post op Abx, DVT px: SCD's, TED's, Early ambulation and chemical px

## 2020-12-03 NOTE — Discharge Instructions (Addendum)

## 2020-12-03 NOTE — Transfer of Care (Signed)
Immediate Anesthesia Transfer of Care Note  Patient: ABEL RA  Procedure(s) Performed: TOTAL KNEE ARTHROPLASTY (Left: Knee)  Patient Location: PACU  Anesthesia Type:Spinal  Level of Consciousness: awake, alert  and oriented  Airway & Oxygen Therapy: Patient Spontanous Breathing and Patient connected to face mask oxygen  Post-op Assessment: Report given to RN and Post -op Vital signs reviewed and stable  Post vital signs: Reviewed and stable  Last Vitals:  Vitals Value Taken Time  BP 136/78 12/03/20 0911  Temp    Pulse 91 12/03/20 0913  Resp 20 12/03/20 0913  SpO2 100 % 12/03/20 0913  Vitals shown include unvalidated device data.  Last Pain:  Vitals:   12/03/20 0556  TempSrc:   PainSc: 0-No pain      Patients Stated Pain Goal: 3 (46/19/01 2224)  Complications: No notable events documented.

## 2020-12-03 NOTE — Anesthesia Procedure Notes (Signed)
Procedure Name: MAC Date/Time: 12/03/2020 7:10 AM Performed by: Maxwell Caul, CRNA Pre-anesthesia Checklist: Patient identified, Emergency Drugs available, Suction available and Patient being monitored Oxygen Delivery Method: Simple face mask

## 2020-12-03 NOTE — Anesthesia Procedure Notes (Signed)
Anesthesia Regional Block: Adductor canal block   Pre-Anesthetic Checklist: , timeout performed,  Correct Patient, Correct Site, Correct Laterality,  Correct Procedure, Correct Position, site marked,  Risks and benefits discussed,  Pre-op evaluation,  At surgeon's request and post-op pain management  Laterality: Left  Prep: Maximum Sterile Barrier Precautions used, chloraprep       Needles:  Injection technique: Single-shot  Needle Type: Echogenic Stimulator Needle     Needle Length: 9cm  Needle Gauge: 21     Additional Needles:   Procedures:,,,, ultrasound used (permanent image in chart),,    Narrative:  Start time: 12/03/2020 6:54 AM End time: 12/03/2020 7:04 AM Injection made incrementally with aspirations every 5 mL.  Performed by: Personally  Anesthesiologist: Roderic Palau, MD  Additional Notes: 2% Lidocaine skin wheel.

## 2020-12-04 ENCOUNTER — Encounter (HOSPITAL_COMMUNITY): Payer: Self-pay | Admitting: Orthopedic Surgery

## 2020-12-05 DIAGNOSIS — R262 Difficulty in walking, not elsewhere classified: Secondary | ICD-10-CM | POA: Diagnosis not present

## 2020-12-05 DIAGNOSIS — M25662 Stiffness of left knee, not elsewhere classified: Secondary | ICD-10-CM | POA: Diagnosis not present

## 2020-12-05 DIAGNOSIS — M6281 Muscle weakness (generalized): Secondary | ICD-10-CM | POA: Diagnosis not present

## 2020-12-05 DIAGNOSIS — M25562 Pain in left knee: Secondary | ICD-10-CM | POA: Diagnosis not present

## 2020-12-06 DIAGNOSIS — M810 Age-related osteoporosis without current pathological fracture: Secondary | ICD-10-CM | POA: Diagnosis not present

## 2020-12-06 DIAGNOSIS — E039 Hypothyroidism, unspecified: Secondary | ICD-10-CM | POA: Diagnosis not present

## 2020-12-06 DIAGNOSIS — E7849 Other hyperlipidemia: Secondary | ICD-10-CM | POA: Diagnosis not present

## 2020-12-11 DIAGNOSIS — M25562 Pain in left knee: Secondary | ICD-10-CM | POA: Diagnosis not present

## 2020-12-12 DIAGNOSIS — M25562 Pain in left knee: Secondary | ICD-10-CM | POA: Diagnosis not present

## 2020-12-12 DIAGNOSIS — M6281 Muscle weakness (generalized): Secondary | ICD-10-CM | POA: Diagnosis not present

## 2020-12-12 DIAGNOSIS — R262 Difficulty in walking, not elsewhere classified: Secondary | ICD-10-CM | POA: Diagnosis not present

## 2020-12-12 DIAGNOSIS — M25662 Stiffness of left knee, not elsewhere classified: Secondary | ICD-10-CM | POA: Diagnosis not present

## 2020-12-19 DIAGNOSIS — M25662 Stiffness of left knee, not elsewhere classified: Secondary | ICD-10-CM | POA: Diagnosis not present

## 2020-12-19 DIAGNOSIS — M6281 Muscle weakness (generalized): Secondary | ICD-10-CM | POA: Diagnosis not present

## 2020-12-19 DIAGNOSIS — R262 Difficulty in walking, not elsewhere classified: Secondary | ICD-10-CM | POA: Diagnosis not present

## 2020-12-24 DIAGNOSIS — M25662 Stiffness of left knee, not elsewhere classified: Secondary | ICD-10-CM | POA: Diagnosis not present

## 2020-12-24 DIAGNOSIS — M6281 Muscle weakness (generalized): Secondary | ICD-10-CM | POA: Diagnosis not present

## 2020-12-24 DIAGNOSIS — M25562 Pain in left knee: Secondary | ICD-10-CM | POA: Diagnosis not present

## 2020-12-24 DIAGNOSIS — R262 Difficulty in walking, not elsewhere classified: Secondary | ICD-10-CM | POA: Diagnosis not present

## 2020-12-26 DIAGNOSIS — M25562 Pain in left knee: Secondary | ICD-10-CM | POA: Diagnosis not present

## 2020-12-26 DIAGNOSIS — M6281 Muscle weakness (generalized): Secondary | ICD-10-CM | POA: Diagnosis not present

## 2020-12-26 DIAGNOSIS — R262 Difficulty in walking, not elsewhere classified: Secondary | ICD-10-CM | POA: Diagnosis not present

## 2020-12-26 DIAGNOSIS — M25662 Stiffness of left knee, not elsewhere classified: Secondary | ICD-10-CM | POA: Diagnosis not present

## 2020-12-31 DIAGNOSIS — H401131 Primary open-angle glaucoma, bilateral, mild stage: Secondary | ICD-10-CM | POA: Diagnosis not present

## 2021-01-01 DIAGNOSIS — M25662 Stiffness of left knee, not elsewhere classified: Secondary | ICD-10-CM | POA: Diagnosis not present

## 2021-01-01 DIAGNOSIS — R262 Difficulty in walking, not elsewhere classified: Secondary | ICD-10-CM | POA: Diagnosis not present

## 2021-01-01 DIAGNOSIS — M6281 Muscle weakness (generalized): Secondary | ICD-10-CM | POA: Diagnosis not present

## 2021-01-01 DIAGNOSIS — M25562 Pain in left knee: Secondary | ICD-10-CM | POA: Diagnosis not present

## 2021-01-06 DIAGNOSIS — E039 Hypothyroidism, unspecified: Secondary | ICD-10-CM | POA: Diagnosis not present

## 2021-01-06 DIAGNOSIS — M810 Age-related osteoporosis without current pathological fracture: Secondary | ICD-10-CM | POA: Diagnosis not present

## 2021-01-06 DIAGNOSIS — E7849 Other hyperlipidemia: Secondary | ICD-10-CM | POA: Diagnosis not present

## 2021-01-08 DIAGNOSIS — M25562 Pain in left knee: Secondary | ICD-10-CM | POA: Diagnosis not present

## 2021-01-10 DIAGNOSIS — R262 Difficulty in walking, not elsewhere classified: Secondary | ICD-10-CM | POA: Diagnosis not present

## 2021-01-10 DIAGNOSIS — M6281 Muscle weakness (generalized): Secondary | ICD-10-CM | POA: Diagnosis not present

## 2021-01-10 DIAGNOSIS — M1712 Unilateral primary osteoarthritis, left knee: Secondary | ICD-10-CM | POA: Diagnosis not present

## 2021-01-10 DIAGNOSIS — M25562 Pain in left knee: Secondary | ICD-10-CM | POA: Diagnosis not present

## 2021-01-10 DIAGNOSIS — Z471 Aftercare following joint replacement surgery: Secondary | ICD-10-CM | POA: Diagnosis not present

## 2021-01-10 DIAGNOSIS — M25662 Stiffness of left knee, not elsewhere classified: Secondary | ICD-10-CM | POA: Diagnosis not present

## 2021-01-10 DIAGNOSIS — Z96652 Presence of left artificial knee joint: Secondary | ICD-10-CM | POA: Diagnosis not present

## 2021-01-13 DIAGNOSIS — M25662 Stiffness of left knee, not elsewhere classified: Secondary | ICD-10-CM | POA: Diagnosis not present

## 2021-01-13 DIAGNOSIS — M25562 Pain in left knee: Secondary | ICD-10-CM | POA: Diagnosis not present

## 2021-01-13 DIAGNOSIS — Z471 Aftercare following joint replacement surgery: Secondary | ICD-10-CM | POA: Diagnosis not present

## 2021-01-13 DIAGNOSIS — M6281 Muscle weakness (generalized): Secondary | ICD-10-CM | POA: Diagnosis not present

## 2021-01-13 DIAGNOSIS — R262 Difficulty in walking, not elsewhere classified: Secondary | ICD-10-CM | POA: Diagnosis not present

## 2021-01-13 DIAGNOSIS — M1712 Unilateral primary osteoarthritis, left knee: Secondary | ICD-10-CM | POA: Diagnosis not present

## 2021-01-13 DIAGNOSIS — Z96652 Presence of left artificial knee joint: Secondary | ICD-10-CM | POA: Diagnosis not present

## 2021-01-22 DIAGNOSIS — M130 Polyarthritis, unspecified: Secondary | ICD-10-CM | POA: Diagnosis not present

## 2021-01-22 DIAGNOSIS — I10 Essential (primary) hypertension: Secondary | ICD-10-CM | POA: Diagnosis not present

## 2021-01-23 DIAGNOSIS — Z471 Aftercare following joint replacement surgery: Secondary | ICD-10-CM | POA: Diagnosis not present

## 2021-01-23 DIAGNOSIS — Z96652 Presence of left artificial knee joint: Secondary | ICD-10-CM | POA: Diagnosis not present

## 2021-01-23 DIAGNOSIS — M1712 Unilateral primary osteoarthritis, left knee: Secondary | ICD-10-CM | POA: Diagnosis not present

## 2021-01-23 DIAGNOSIS — M6281 Muscle weakness (generalized): Secondary | ICD-10-CM | POA: Diagnosis not present

## 2021-01-23 DIAGNOSIS — R262 Difficulty in walking, not elsewhere classified: Secondary | ICD-10-CM | POA: Diagnosis not present

## 2021-01-23 DIAGNOSIS — M25562 Pain in left knee: Secondary | ICD-10-CM | POA: Diagnosis not present

## 2021-01-23 DIAGNOSIS — M25662 Stiffness of left knee, not elsewhere classified: Secondary | ICD-10-CM | POA: Diagnosis not present

## 2021-02-12 DIAGNOSIS — M130 Polyarthritis, unspecified: Secondary | ICD-10-CM | POA: Diagnosis not present

## 2021-02-12 DIAGNOSIS — I10 Essential (primary) hypertension: Secondary | ICD-10-CM | POA: Diagnosis not present

## 2021-02-12 DIAGNOSIS — E782 Mixed hyperlipidemia: Secondary | ICD-10-CM | POA: Diagnosis not present

## 2021-02-12 DIAGNOSIS — E039 Hypothyroidism, unspecified: Secondary | ICD-10-CM | POA: Diagnosis not present

## 2021-02-12 DIAGNOSIS — M810 Age-related osteoporosis without current pathological fracture: Secondary | ICD-10-CM | POA: Diagnosis not present

## 2021-03-31 ENCOUNTER — Ambulatory Visit: Payer: Self-pay | Admitting: Nurse Practitioner

## 2021-05-06 DIAGNOSIS — E7849 Other hyperlipidemia: Secondary | ICD-10-CM | POA: Diagnosis not present

## 2021-05-06 DIAGNOSIS — M810 Age-related osteoporosis without current pathological fracture: Secondary | ICD-10-CM | POA: Diagnosis not present

## 2021-05-06 DIAGNOSIS — E039 Hypothyroidism, unspecified: Secondary | ICD-10-CM | POA: Diagnosis not present

## 2021-05-15 ENCOUNTER — Other Ambulatory Visit: Payer: Self-pay | Admitting: Family Medicine

## 2021-05-15 DIAGNOSIS — M130 Polyarthritis, unspecified: Secondary | ICD-10-CM | POA: Diagnosis not present

## 2021-05-15 DIAGNOSIS — N3281 Overactive bladder: Secondary | ICD-10-CM | POA: Diagnosis not present

## 2021-05-15 DIAGNOSIS — I10 Essential (primary) hypertension: Secondary | ICD-10-CM | POA: Diagnosis not present

## 2021-05-15 DIAGNOSIS — G629 Polyneuropathy, unspecified: Secondary | ICD-10-CM | POA: Diagnosis not present

## 2021-05-15 DIAGNOSIS — M810 Age-related osteoporosis without current pathological fracture: Secondary | ICD-10-CM | POA: Diagnosis not present

## 2021-05-15 DIAGNOSIS — Z1231 Encounter for screening mammogram for malignant neoplasm of breast: Secondary | ICD-10-CM

## 2021-05-15 DIAGNOSIS — E782 Mixed hyperlipidemia: Secondary | ICD-10-CM | POA: Diagnosis not present

## 2021-05-15 DIAGNOSIS — E785 Hyperlipidemia, unspecified: Secondary | ICD-10-CM | POA: Diagnosis not present

## 2021-05-15 DIAGNOSIS — E039 Hypothyroidism, unspecified: Secondary | ICD-10-CM | POA: Diagnosis not present

## 2021-06-16 DIAGNOSIS — M792 Neuralgia and neuritis, unspecified: Secondary | ICD-10-CM | POA: Diagnosis not present

## 2021-06-25 DIAGNOSIS — H524 Presbyopia: Secondary | ICD-10-CM | POA: Diagnosis not present

## 2021-06-25 DIAGNOSIS — H401131 Primary open-angle glaucoma, bilateral, mild stage: Secondary | ICD-10-CM | POA: Diagnosis not present

## 2021-06-25 DIAGNOSIS — H35033 Hypertensive retinopathy, bilateral: Secondary | ICD-10-CM | POA: Diagnosis not present

## 2021-06-25 DIAGNOSIS — H04123 Dry eye syndrome of bilateral lacrimal glands: Secondary | ICD-10-CM | POA: Diagnosis not present

## 2021-06-25 DIAGNOSIS — H25813 Combined forms of age-related cataract, bilateral: Secondary | ICD-10-CM | POA: Diagnosis not present

## 2021-07-06 DIAGNOSIS — E039 Hypothyroidism, unspecified: Secondary | ICD-10-CM | POA: Diagnosis not present

## 2021-07-06 DIAGNOSIS — E7849 Other hyperlipidemia: Secondary | ICD-10-CM | POA: Diagnosis not present

## 2021-07-07 DIAGNOSIS — M792 Neuralgia and neuritis, unspecified: Secondary | ICD-10-CM | POA: Diagnosis not present

## 2021-07-25 DIAGNOSIS — G629 Polyneuropathy, unspecified: Secondary | ICD-10-CM | POA: Diagnosis not present

## 2021-07-25 DIAGNOSIS — I739 Peripheral vascular disease, unspecified: Secondary | ICD-10-CM | POA: Diagnosis not present

## 2021-08-06 DIAGNOSIS — E7849 Other hyperlipidemia: Secondary | ICD-10-CM | POA: Diagnosis not present

## 2021-08-06 DIAGNOSIS — E039 Hypothyroidism, unspecified: Secondary | ICD-10-CM | POA: Diagnosis not present

## 2021-09-15 DIAGNOSIS — E039 Hypothyroidism, unspecified: Secondary | ICD-10-CM | POA: Diagnosis not present

## 2021-09-15 DIAGNOSIS — I10 Essential (primary) hypertension: Secondary | ICD-10-CM | POA: Diagnosis not present

## 2021-09-15 DIAGNOSIS — G629 Polyneuropathy, unspecified: Secondary | ICD-10-CM | POA: Diagnosis not present

## 2021-09-15 DIAGNOSIS — Z0000 Encounter for general adult medical examination without abnormal findings: Secondary | ICD-10-CM | POA: Diagnosis not present

## 2021-09-15 DIAGNOSIS — E782 Mixed hyperlipidemia: Secondary | ICD-10-CM | POA: Diagnosis not present

## 2021-10-10 ENCOUNTER — Ambulatory Visit
Admission: RE | Admit: 2021-10-10 | Discharge: 2021-10-10 | Disposition: A | Discharge status: Home / Self Care | Payer: Medicare Other | Source: Ambulatory Visit | Attending: Family Medicine | Admitting: Family Medicine

## 2021-10-10 DIAGNOSIS — Z1231 Encounter for screening mammogram for malignant neoplasm of breast: Secondary | ICD-10-CM

## 2021-11-20 DIAGNOSIS — E039 Hypothyroidism, unspecified: Secondary | ICD-10-CM | POA: Diagnosis not present

## 2021-11-20 DIAGNOSIS — G629 Polyneuropathy, unspecified: Secondary | ICD-10-CM | POA: Diagnosis not present

## 2021-11-20 DIAGNOSIS — I10 Essential (primary) hypertension: Secondary | ICD-10-CM | POA: Diagnosis not present

## 2021-11-20 DIAGNOSIS — J0100 Acute maxillary sinusitis, unspecified: Secondary | ICD-10-CM | POA: Diagnosis not present

## 2021-12-03 DIAGNOSIS — I10 Essential (primary) hypertension: Secondary | ICD-10-CM | POA: Diagnosis not present

## 2021-12-03 DIAGNOSIS — E782 Mixed hyperlipidemia: Secondary | ICD-10-CM | POA: Diagnosis not present

## 2021-12-03 DIAGNOSIS — J019 Acute sinusitis, unspecified: Secondary | ICD-10-CM | POA: Diagnosis not present

## 2021-12-31 DIAGNOSIS — H401131 Primary open-angle glaucoma, bilateral, mild stage: Secondary | ICD-10-CM | POA: Diagnosis not present

## 2022-01-16 DIAGNOSIS — E039 Hypothyroidism, unspecified: Secondary | ICD-10-CM | POA: Diagnosis not present

## 2022-01-16 DIAGNOSIS — E782 Mixed hyperlipidemia: Secondary | ICD-10-CM | POA: Diagnosis not present

## 2022-01-16 DIAGNOSIS — I10 Essential (primary) hypertension: Secondary | ICD-10-CM | POA: Diagnosis not present

## 2022-01-16 DIAGNOSIS — G629 Polyneuropathy, unspecified: Secondary | ICD-10-CM | POA: Diagnosis not present

## 2022-04-14 DIAGNOSIS — I10 Essential (primary) hypertension: Secondary | ICD-10-CM | POA: Diagnosis not present

## 2022-04-14 DIAGNOSIS — J019 Acute sinusitis, unspecified: Secondary | ICD-10-CM | POA: Diagnosis not present

## 2022-04-14 DIAGNOSIS — G629 Polyneuropathy, unspecified: Secondary | ICD-10-CM | POA: Diagnosis not present

## 2022-05-05 DIAGNOSIS — I10 Essential (primary) hypertension: Secondary | ICD-10-CM | POA: Diagnosis not present

## 2022-05-05 DIAGNOSIS — E039 Hypothyroidism, unspecified: Secondary | ICD-10-CM | POA: Diagnosis not present

## 2022-06-22 DIAGNOSIS — L2082 Flexural eczema: Secondary | ICD-10-CM | POA: Diagnosis not present

## 2022-06-22 DIAGNOSIS — E039 Hypothyroidism, unspecified: Secondary | ICD-10-CM | POA: Diagnosis not present

## 2022-06-22 DIAGNOSIS — I10 Essential (primary) hypertension: Secondary | ICD-10-CM | POA: Diagnosis not present

## 2022-06-29 ENCOUNTER — Other Ambulatory Visit: Payer: Self-pay | Admitting: Family Medicine

## 2022-06-29 DIAGNOSIS — Z1231 Encounter for screening mammogram for malignant neoplasm of breast: Secondary | ICD-10-CM

## 2022-07-13 DIAGNOSIS — E039 Hypothyroidism, unspecified: Secondary | ICD-10-CM | POA: Diagnosis not present

## 2022-07-13 DIAGNOSIS — I10 Essential (primary) hypertension: Secondary | ICD-10-CM | POA: Diagnosis not present

## 2022-07-13 DIAGNOSIS — L2082 Flexural eczema: Secondary | ICD-10-CM | POA: Diagnosis not present

## 2022-07-17 DIAGNOSIS — H25813 Combined forms of age-related cataract, bilateral: Secondary | ICD-10-CM | POA: Diagnosis not present

## 2022-07-17 DIAGNOSIS — H35033 Hypertensive retinopathy, bilateral: Secondary | ICD-10-CM | POA: Diagnosis not present

## 2022-07-17 DIAGNOSIS — H401131 Primary open-angle glaucoma, bilateral, mild stage: Secondary | ICD-10-CM | POA: Diagnosis not present

## 2022-07-17 DIAGNOSIS — H04123 Dry eye syndrome of bilateral lacrimal glands: Secondary | ICD-10-CM | POA: Diagnosis not present

## 2022-08-24 DIAGNOSIS — H401131 Primary open-angle glaucoma, bilateral, mild stage: Secondary | ICD-10-CM | POA: Diagnosis not present

## 2022-09-04 ENCOUNTER — Telehealth: Payer: Self-pay | Admitting: Cardiology

## 2022-09-04 NOTE — Telephone Encounter (Signed)
Pt c/o of Chest Pain: STAT if CP now or developed within 24 hours  1. Are you having CP right now?   No  2. Are you experiencing any other symptoms (ex. SOB, nausea, vomiting, sweating)?   NO  3. How long have you been experiencing CP?  Since she started on medication 3 months ago  4. Is your CP continuous or coming and going?   Coming and going  5. Have you taken Nitroglycerin?  No ? Patient stated she feels tightness in her chest sometimes.  Patient stated she believes it may be related to her citalopram (CELEXA) 20 MG tablet medication.

## 2022-09-04 NOTE — Telephone Encounter (Signed)
Patient C/O chest tightness. She does not feel well at all on this and has been for several months. No sweating, nor nausea.  No SOB.  She believes this is from the Celexa prescribed by her PCP.  Advised to call her PCP to see about stopping this and if needs titration due to length of her being on medication and see if a new medication can be started. She states understanding and will call them

## 2022-09-13 ENCOUNTER — Emergency Department (HOSPITAL_COMMUNITY): Payer: 59

## 2022-09-13 ENCOUNTER — Encounter (HOSPITAL_COMMUNITY): Payer: Self-pay | Admitting: Radiology

## 2022-09-13 ENCOUNTER — Other Ambulatory Visit: Payer: Self-pay

## 2022-09-13 ENCOUNTER — Emergency Department (HOSPITAL_COMMUNITY)
Admission: EM | Admit: 2022-09-13 | Discharge: 2022-09-13 | Disposition: A | Discharge status: Home / Self Care | Payer: 59 | Attending: Emergency Medicine | Admitting: Emergency Medicine

## 2022-09-13 DIAGNOSIS — Z79899 Other long term (current) drug therapy: Secondary | ICD-10-CM | POA: Insufficient documentation

## 2022-09-13 DIAGNOSIS — E039 Hypothyroidism, unspecified: Secondary | ICD-10-CM | POA: Diagnosis not present

## 2022-09-13 DIAGNOSIS — S0990XA Unspecified injury of head, initial encounter: Secondary | ICD-10-CM | POA: Diagnosis not present

## 2022-09-13 DIAGNOSIS — S7002XA Contusion of left hip, initial encounter: Secondary | ICD-10-CM | POA: Insufficient documentation

## 2022-09-13 DIAGNOSIS — M25552 Pain in left hip: Secondary | ICD-10-CM | POA: Diagnosis not present

## 2022-09-13 DIAGNOSIS — S79912A Unspecified injury of left hip, initial encounter: Secondary | ICD-10-CM | POA: Diagnosis not present

## 2022-09-13 DIAGNOSIS — M5030 Other cervical disc degeneration, unspecified cervical region: Secondary | ICD-10-CM | POA: Diagnosis not present

## 2022-09-13 DIAGNOSIS — I110 Hypertensive heart disease with heart failure: Secondary | ICD-10-CM | POA: Insufficient documentation

## 2022-09-13 DIAGNOSIS — Z7982 Long term (current) use of aspirin: Secondary | ICD-10-CM | POA: Diagnosis not present

## 2022-09-13 DIAGNOSIS — S199XXA Unspecified injury of neck, initial encounter: Secondary | ICD-10-CM | POA: Diagnosis not present

## 2022-09-13 DIAGNOSIS — W19XXXA Unspecified fall, initial encounter: Secondary | ICD-10-CM

## 2022-09-13 DIAGNOSIS — W06XXXA Fall from bed, initial encounter: Secondary | ICD-10-CM | POA: Diagnosis not present

## 2022-09-13 DIAGNOSIS — Z85850 Personal history of malignant neoplasm of thyroid: Secondary | ICD-10-CM | POA: Diagnosis not present

## 2022-09-13 DIAGNOSIS — R600 Localized edema: Secondary | ICD-10-CM | POA: Diagnosis not present

## 2022-09-13 DIAGNOSIS — M47811 Spondylosis without myelopathy or radiculopathy, occipito-atlanto-axial region: Secondary | ICD-10-CM | POA: Diagnosis not present

## 2022-09-13 DIAGNOSIS — I509 Heart failure, unspecified: Secondary | ICD-10-CM | POA: Insufficient documentation

## 2022-09-13 DIAGNOSIS — M47812 Spondylosis without myelopathy or radiculopathy, cervical region: Secondary | ICD-10-CM | POA: Diagnosis not present

## 2022-09-13 DIAGNOSIS — S0003XA Contusion of scalp, initial encounter: Secondary | ICD-10-CM | POA: Diagnosis not present

## 2022-09-13 LAB — BASIC METABOLIC PANEL
Anion gap: 8 (ref 5–15)
BUN: 15 mg/dL (ref 8–23)
CO2: 22 mmol/L (ref 22–32)
Calcium: 8.9 mg/dL (ref 8.9–10.3)
Chloride: 109 mmol/L (ref 98–111)
Creatinine, Ser: 0.99 mg/dL (ref 0.44–1.00)
GFR, Estimated: >60 mL/min (ref >60)
Glucose, Bld: 84 mg/dL (ref 70–99)
Potassium: 4.3 mmol/L (ref 3.5–5.1)
Sodium: 139 mmol/L (ref 135–145)

## 2022-09-13 LAB — CBC
HCT: 44.1 % (ref 36.0–46.0)
Hemoglobin: 13.9 g/dL (ref 12.0–15.0)
MCH: 30.3 pg (ref 26.0–34.0)
MCHC: 31.5 g/dL (ref 30.0–36.0)
MCV: 96.1 fL (ref 80.0–100.0)
Platelets: 94 10*3/uL — ABNORMAL LOW (ref 150–400)
RBC: 4.59 MIL/uL (ref 3.87–5.11)
RDW: 13.7 % (ref 11.5–15.5)
WBC: 5.4 10*3/uL (ref 4.0–10.5)
nRBC: 0 % (ref 0.0–0.2)

## 2022-09-13 NOTE — ED Provider Notes (Signed)
[place] EMERGENCY DEPARTMENT AT [facility] Provider Note   CSN: [id number] Arrival date & time: 09/13/22  4098     History  Chief Complaint  Patient presents with   Fall   Head Injury   lt hip pain    [name] [name] is a 72 y.o. female with history of hypertension, CHF, bradycardia, paroxysmal A-fib not on anticoagulants, hypothyroidism, depression, thyroid cancer, who presents emergency department complaining of a fall.  Patient states that last night she rolled out of her bed while sleeping, striking her forehead on the nightstand.  She landed on her left side on the ground.  She is mainly complaining of left hip pain.  Denies any loss of consciousness, weakness, dizziness.   Fall  Head Injury      Home Medications Prior to Admission medications   Medication Sig Start Date End Date Taking? Authorizing Provider  acetaminophen (TYLENOL) 500 MG tablet Take 2 tablets (1,000 mg total) by mouth every 8 (eight) hours as needed for mild pain or moderate pain. 12/03/20   Jenne Pane, PA-C  aspirin EC 81 MG tablet Take 1 tablet (81 mg total) by mouth 2 (two) times daily. For DVT prophylaxis for 30 days after surgery. 12/03/20   Jenne Pane, PA-C  brimonidine (ALPHAGAN) 0.2 % ophthalmic solution Place 1 drop into both eyes 3 (three) times daily. 08/07/20   [provider]  Calcium Carbonate (CALTRATE 600 PO) Take 1 tablet by mouth daily.     [provider]  citalopram (CELEXA) 20 MG tablet Take 20 mg by mouth daily. 06/02/20   [provider]  fluticasone (FLONASE) 50 MCG/ACT nasal spray Place 1 spray into both nostrils daily. 10/10/20   [provider]  HYDROcodone-acetaminophen (NORCO) 10-325 MG tablet Take 1 tablet by mouth every 6 (six) hours as needed for severe pain. 12/03/20   Jenne Pane, PA-C  levothyroxine (SYNTHROID) 75 MCG tablet Take 75 mcg by mouth every morning. 09/28/19   [provider]  methocarbamol  (ROBAXIN-750) 750 MG tablet Take 1 tablet (750 mg total) by mouth every 8 (eight) hours as needed for muscle spasms. 12/03/20   Jenne Pane, PA-C  montelukast (SINGULAIR) 10 MG tablet Take 10 mg by mouth in the morning.    [provider]  Multiple Vitamin (MULTIVITAMIN WITH MINERALS) TABS tablet Take 1 tablet by mouth daily. Centrum Silver for Women    [provider]  omeprazole (PRILOSEC OTC) 20 MG tablet Take 1 tablet (20 mg total) by mouth daily. For gastric protection 12/03/20 01/02/21  Levester Fresh M, PA-C  ondansetron (ZOFRAN ODT) 4 MG disintegrating tablet Take 1 tablet (4 mg total) by mouth 2 (two) times daily as needed for nausea or vomiting. 12/03/20   Jenne Pane, PA-C  spironolactone (ALDACTONE) 25 MG tablet Take 25 mg by mouth every morning. 08/09/20   [provider]  Travoprost, BAK Free, (TRAVATAMN) 0.004 % SOLN ophthalmic solution Place 1 drop into both eyes at bedtime.      [provider]      Allergies    Penicillins    Review of Systems   Review of Systems  Musculoskeletal:  Positive for arthralgias.  All other systems reviewed and are negative.   Physical Exam Updated Vital Signs BP (!) 157/96   Pulse 60   Temp 98 F (36.7 C)   Resp 18   Ht 5\' 2"  (1.575 m)   Wt 72.6 kg   SpO2  100%   BMI 29.26 kg/m  Physical Exam Vitals and nursing note reviewed.  Constitutional:      Appearance: Normal appearance.  HENT:     Head: Normocephalic and atraumatic.  Eyes:     General: Lids are normal.     Extraocular Movements: Extraocular movements intact.     Conjunctiva/sclera: Conjunctivae normal.     Pupils: Pupils are equal, round, and reactive to light.  Pulmonary:     Effort: Pulmonary effort is normal. No respiratory distress.  Chest:     Comments: Chest stable  Musculoskeletal:     Comments: No midline spinal tenderness, step offs or crepitus Pelvis stable Left lateral hip with tenderness, large hematoma Able to  ambulate but painful  Skin:    General: Skin is warm and dry.  Neurological:     Mental Status: She is alert.  Psychiatric:        Mood and Affect: Mood normal.        Behavior: Behavior normal.     ED Results / Procedures / Treatments   Labs (all labs ordered are listed, but only abnormal results are displayed) Labs Reviewed  CBC - Abnormal; Notable for the following components:      Result Value   Platelets 94 (*)    All other components within normal limits  BASIC METABOLIC PANEL    EKG None  Radiology CT Hip Left Wo Contrast  Result Date: 09/13/2022 CLINICAL DATA:  Hip trauma, fracture suspected, xray done EXAM: CT OF THE LEFT HIP WITHOUT CONTRAST TECHNIQUE: Multidetector CT imaging of the left hip was performed according to the standard protocol. Multiplanar CT image reconstructions were also generated. RADIATION DOSE REDUCTION: This exam was performed according to the departmental dose-optimization program which includes automated exposure control, adjustment of the mA and/or kV according to patient size and/or use of iterative reconstruction technique. COMPARISON:  09/13/2022 FINDINGS: Bones/Joint/Cartilage No acute fracture. No dislocation. No evidence of femoral head avascular necrosis. Mild left hip joint space narrowing. No appreciable hip joint effusion. Included portion of the left hemipelvis appears intact without evidence of fracture or diastasis. No suspicious lytic or sclerotic bone lesion. Ligaments Suboptimally assessed by CT. Muscles and Tendons No acute musculotendinous abnormality by CT. Soft tissues Soft tissue swelling and edema within the subcutaneous tissues at the lateral aspect of the left hip. Focal hematoma within the subcutaneous soft tissues adjacent to the greater trochanter measuring 4.6 x 2.2 x 4.0 cm (volume = 21 cm^3). No left inguinal lymphadenopathy. IMPRESSION: 1. No acute fracture or dislocation of the left hip. 2. Soft tissue swelling and edema  within the subcutaneous tissues at the lateral aspect of the left hip. Focal hematoma adjacent to the greater trochanter measuring 4.6 x 2.2 x 4.0 cm (volume = 21 cc). Electronically Signed   By: Duanne Guess D.O.   On: 09/13/2022 12:18   CT Head Wo Contrast  Result Date: 09/13/2022 CLINICAL DATA:  Blunt trauma. EXAM: CT HEAD WITHOUT CONTRAST TECHNIQUE: Contiguous axial images were obtained from the base of the skull through the vertex without intravenous contrast. RADIATION DOSE REDUCTION: This exam was performed according to the departmental dose-optimization program which includes automated exposure control, adjustment of the mA and/or kV according to patient size and/or use of iterative reconstruction technique. COMPARISON:  None Available. FINDINGS: Brain: No acute intracranial hemorrhage. No focal mass lesion. No CT evidence of acute infarction. No midline shift or mass effect. No hydrocephalus. Basilar cisterns are patent. Vascular: No hyperdense vessel  or unexpected calcification. Skull: Normal. Negative for fracture or focal lesion. Sinuses/Orbits: Paranasal sinuses and mastoid air cells are clear. Orbits are clear. Other: None. IMPRESSION: 1. No intracranial trauma. 2. No skull fracture. 3. Very small frontal scalp hematoma. Electronically Signed   By: Genevive Bi M.D.   On: 09/13/2022 11:00   CT Cervical Spine Wo Contrast  Result Date: 09/13/2022 CLINICAL DATA:  Neck trauma (Age >= 65y) EXAM: CT CERVICAL SPINE WITHOUT CONTRAST TECHNIQUE: Multidetector CT imaging of the cervical spine was performed without intravenous contrast. Multiplanar CT image reconstructions were also generated. RADIATION DOSE REDUCTION: This exam was performed according to the departmental dose-optimization program which includes automated exposure control, adjustment of the mA and/or kV according to patient size and/or use of iterative reconstruction technique. COMPARISON:  None Available. FINDINGS: Alignment: Facet  joints are aligned without dislocation or traumatic listhesis. Dens and lateral masses are aligned. Skull base and vertebrae: No acute fracture. No primary bone lesion or focal pathologic process. Soft tissues and spinal canal: No prevertebral fluid or swelling. No visible canal hematoma. Disc levels: Moderate multilevel degenerative disc disease and facet arthropathy. Advanced degenerative changes at the C1-C2 articulation on the left. Upper chest: Negative. Other: None. IMPRESSION: No acute fracture or traumatic listhesis of the cervical spine. Electronically Signed   By: Duanne Guess D.O.   On: 09/13/2022 10:58   DG Hip Unilat W or Wo Pelvis 2-3 Views Left  Result Date: 09/13/2022 CLINICAL DATA:  Fall with left hip pain. EXAM: DG HIP (WITH OR WITHOUT PELVIS) 2-3V LEFT COMPARISON:  None Available. FINDINGS: No evidence for an acute fracture. SI joints and symphysis pubis unremarkable. No evidence for left hip dislocation. Infiltrative soft tissue density in the subcutaneous fat overlying the left hip in the setting of trauma suggests hemorrhage/hematoma and edema. IMPRESSION: 1. No evidence for an acute fracture or dislocation. 2. Infiltrative soft tissue density in the subcutaneous fat overlying the left hip suggests hemorrhage/hematoma and edema. Electronically Signed   By: Kennith Center M.D.   On: 09/13/2022 10:32    Procedures Procedures    Medications Ordered in ED Medications - No data to display  ED Course/ Medical Decision Making/ A&P                             Medical Decision Making Amount and/or Complexity of Data Reviewed Radiology: ordered.   This patient is a 72 y.o. female  who presents to the ED for concern of left hip pain, fall out of bed last night.   Past Medical History / Co-morbidities / Social History: hypertension, CHF, bradycardia, paroxysmal A-fib not on anticoagulants, hypothyroidism, depression, thyroid cancer,  Additional history: Chart reviewed.  Pertinent results include: reviewed home meds, pt not on anticoagulation  Physical Exam: Physical exam performed. The pertinent findings include: Large hematoma over left hip with tenderness. No midline spinal tenderness, step offs or crepitus. Chest and pelvis stable.   Lab Tests/Imaging studies: I personally interpreted labs/imaging and the pertinent results include: CBC and BMP unremarkable.  CT head without acute intracranial abnormality, very small frontal scalp hematoma, cervical spine unremarkable.  X-ray of the hip with soft tissue density and subcutaneous fat overlying the left hip suggesting hematoma.  CT hip with no acute fracture or dislocation, focal hematoma adjacent to the greater trochanter..  I agree with the radiologist interpretation.  Disposition: After consideration of the diagnostic results and the patients response to treatment, I feel  that emergency department workup does not suggest an emergent condition requiring admission or immediate intervention beyond what has been performed at this time. The plan is: Discharge home with symptomatic management of pain after fall.  Imaging reassuring. The patient is safe for discharge and has been instructed to return immediately for worsening symptoms, change in symptoms or any other concerns.  I discussed this case with my attending physician Dr. Particia Nearing who cosigned this note including patient's presenting symptoms, physical exam, and planned diagnostics and interventions. Attending physician stated agreement with plan or made changes to plan which were implemented.   Final Clinical Impression(s) / ED Diagnoses Final diagnoses:  Fall, initial encounter  Hematoma of left hip, initial encounter  Injury of head, initial encounter    Rx / DC Orders ED Discharge Orders     None      Portions of this report may have been transcribed using voice recognition software. Every effort was made to ensure accuracy; however, inadvertent  computerized transcription errors may be present.    Jeanella Flattery 09/13/22 1312    Jacalyn Lefevre, MD 09/13/22 1424

## 2022-09-13 NOTE — Discharge Instructions (Addendum)
You were seen in the emergency department after a fall.  As we discussed the imaging of your head, neck, and hips looked good.  There was no evidence of broken bones or internal bleeding.  You do have the large bruise on your left hip, and this should resolve after a few days.  I recommend taking over-the-counter medications as needed for pain.  You can use ice or heat for comfort.  Continue to monitor how you're doing and return to the ER for new or worsening symptoms.

## 2022-09-13 NOTE — ED Triage Notes (Addendum)
Pt reports rolling out of bed while sleeping and hitting her forehead on a night stand. Denies LOC or use of blood thinners. Landed on her lt side and c/o hip pain. Denies new onset weakness, dizziness.

## 2022-09-15 DIAGNOSIS — E039 Hypothyroidism, unspecified: Secondary | ICD-10-CM | POA: Diagnosis not present

## 2022-09-15 DIAGNOSIS — M810 Age-related osteoporosis without current pathological fracture: Secondary | ICD-10-CM | POA: Diagnosis not present

## 2022-09-15 DIAGNOSIS — E782 Mixed hyperlipidemia: Secondary | ICD-10-CM | POA: Diagnosis not present

## 2022-09-15 DIAGNOSIS — I10 Essential (primary) hypertension: Secondary | ICD-10-CM | POA: Diagnosis not present

## 2022-09-25 DIAGNOSIS — Z1159 Encounter for screening for other viral diseases: Secondary | ICD-10-CM | POA: Diagnosis not present

## 2022-09-25 DIAGNOSIS — Z0000 Encounter for general adult medical examination without abnormal findings: Secondary | ICD-10-CM | POA: Diagnosis not present

## 2022-09-25 DIAGNOSIS — I110 Hypertensive heart disease with heart failure: Secondary | ICD-10-CM | POA: Diagnosis not present

## 2022-09-25 DIAGNOSIS — J3089 Other allergic rhinitis: Secondary | ICD-10-CM | POA: Diagnosis not present

## 2022-09-25 DIAGNOSIS — E039 Hypothyroidism, unspecified: Secondary | ICD-10-CM | POA: Diagnosis not present

## 2022-09-25 DIAGNOSIS — I5032 Chronic diastolic (congestive) heart failure: Secondary | ICD-10-CM | POA: Diagnosis not present

## 2022-09-25 DIAGNOSIS — J302 Other seasonal allergic rhinitis: Secondary | ICD-10-CM | POA: Diagnosis not present

## 2022-09-25 DIAGNOSIS — I10 Essential (primary) hypertension: Secondary | ICD-10-CM | POA: Diagnosis not present

## 2022-09-25 DIAGNOSIS — M792 Neuralgia and neuritis, unspecified: Secondary | ICD-10-CM | POA: Diagnosis not present

## 2022-09-25 DIAGNOSIS — M8588 Other specified disorders of bone density and structure, other site: Secondary | ICD-10-CM | POA: Diagnosis not present

## 2022-09-25 DIAGNOSIS — E782 Mixed hyperlipidemia: Secondary | ICD-10-CM | POA: Diagnosis not present

## 2022-09-29 DIAGNOSIS — M8588 Other specified disorders of bone density and structure, other site: Secondary | ICD-10-CM | POA: Diagnosis not present

## 2022-10-11 NOTE — Progress Notes (Signed)
Cardiology Office Note:    Date:  10/20/2022   ID:  [name] [name], DOB 1950-12-10, MRN [id number]  PCP:  Aliene Beams, MD   [facility] Providers Cardiologist:  Olga Millers, MD Cardiology APP:  Marcelino Duster, PA     Referring MD: Renaye Rakers, MD   Chief Complaint  Patient presents with   Follow-up    HFpEF, PAF, bradycardia    History of Present Illness:    [name] [name] is a 72 y.o. female with a hx of chronic diastolic heart failure, PAF in the setting of hyperthyroidism and bradycardia.  Echocardiogram 01/2006 showed normal LV function and trivial MR/TR.  Nuclear stress test 12/2009 with soft tissue attenuation but no ischemia.  She was last seen by Dr. Jens Som 10/17/2020 and was able to walk 1 to 2 miles without dyspnea or chest pain.  She was cleared to proceed with knee surgery at that time.  She was seen in the ER 09/13/22 after rolling and falling out of bed. She is not on OAC. She was evaluated and discharged without admission.   She presents today for overdue 2-year follow-up. She states she tried celexa and hydroxyzine but they caused chest pain, that resolved when she stopped the medications. No cardiac complaints, no shortness of breath. She remains active and walks at the [facility] 3-4 times per week (10 laps).    Past Medical History:  Diagnosis Date   ATRIAL FIBRILLATION, PAROXYSMAL    BRADYCARDIA    Cancer (HCC)    hx of thyroid cancer   Cataract    not a surgical candidate at this time (08/22/2020)   Congestive heart failure, unspecified    COPD    Depression    not on meds at this time   HYPERTENSION    HYPOTHYROIDISM    on meds   OSTEOARTHRITIS    bilateral knees   Seasonal allergies     Past Surgical History:  Procedure Laterality Date   THYROIDECTOMY     TOTAL KNEE ARTHROPLASTY Left 12/03/2020   Procedure: TOTAL KNEE ARTHROPLASTY;  Surgeon: Sheral Apley, MD;  Location: [facility];  Service: Orthopedics;  Laterality: Left;    VAGINAL HYSTERECTOMY     WISDOM TOOTH EXTRACTION      Current Medications: Current Meds  Medication Sig   acetaminophen (TYLENOL) 500 MG tablet Take 2 tablets (1,000 mg total) by mouth every 8 (eight) hours as needed for mild pain or moderate pain.   aspirin EC 81 MG tablet Take 1 tablet (81 mg total) by mouth 2 (two) times daily. For DVT prophylaxis for 30 days after surgery.   brimonidine (ALPHAGAN) 0.2 % ophthalmic solution Place 1 drop into both eyes 3 (three) times daily.   fluticasone (FLONASE) 50 MCG/ACT nasal spray Place 1 spray into both nostrils daily.   GEMTESA 75 MG TABS Take 1 tablet by mouth daily.   levocetirizine (XYZAL) 5 MG tablet Take 5 mg by mouth every evening.   levothyroxine (SYNTHROID) 75 MCG tablet Take 75 mcg by mouth every morning.   montelukast (SINGULAIR) 10 MG tablet Take 10 mg by mouth in the morning.   Multiple Vitamin (MULTIVITAMIN WITH MINERALS) TABS tablet Take 1 tablet by mouth daily. Centrum Silver for Women   rosuvastatin (CRESTOR) 10 MG tablet Take 10 mg by mouth daily.   SIMBRINZA 1-0.2 % SUSP Apply 1 drop to eye 3 (three) times daily.   spironolactone (ALDACTONE) 25 MG tablet Take 25 mg by mouth every morning.  Travoprost, BAK Free, (TRAVATAMN) 0.004 % SOLN ophthalmic solution Place 1 drop into both eyes at bedtime.       Allergies:   Penicillins   Social History   Socioeconomic History   Marital status: Single    Spouse name: Not on file   Number of children: Not on file   Years of education: Not on file   Highest education level: Not on file  Occupational History   Not on file  Tobacco Use   Smoking status: Former    Types: Cigarettes   Smokeless tobacco: Never  Vaping Use   Vaping status: Never Used  Substance and Sexual Activity   Alcohol use: Never   Drug use: Never   Sexual activity: Not on file  Other Topics Concern   Not on file  Social History Narrative   Not on file   Social Determinants of Health   Financial  Resource Strain: Not on file  Food Insecurity: Not on file  Transportation Needs: Not on file  Physical Activity: Not on file  Stress: Not on file  Social Connections: Not on file     Family History: The patient's family history is negative for Colon polyps, Colon cancer, Esophageal cancer, Stomach cancer, and Rectal cancer.  ROS:   Please see the history of present illness.     All other systems reviewed and are negative.  EKGs/Labs/Other Studies Reviewed:    The following studies were reviewed today:       EKG Interpretation Date/Time:  Tuesday October 20 2022 09:06:33 EDT Ventricular Rate:  43 PR Interval:  174 QRS Duration:  72 QT Interval:  432 QTC Calculation: 365 R Axis:   -24  Text Interpretation: Marked sinus bradycardia T wave abnormality, consider lateral ischemia When compared with ECG of 30-Dec-2011 20:56, Nonspecific T wave abnormality, worse in Inferior leads T wave inversion now evident in Lateral leads Confirmed by Micah Flesher ([id number]) on 10/20/2022 9:17:23 AM    Recent Labs: 09/13/2022: BUN 15; Creatinine, Ser 0.99; Hemoglobin 13.9; Platelets 94; Potassium 4.3; Sodium 139  Recent Lipid Panel    Component Value Date/Time   CHOL 176 07/28/2007 2048   TRIG 120 07/28/2007 2048   HDL 62 07/28/2007 2048   CHOLHDL 2.8 Ratio 07/28/2007 2048   VLDL 24 07/28/2007 2048   LDLCALC 90 07/28/2007 2048     Risk Assessment/Calculations:                Physical Exam:    VS:  BP 118/70   Pulse (!) 43   Ht 5\' 2"  (1.575 m)   Wt 149 lb 6.4 oz (67.8 kg)   SpO2 94%   BMI 27.33 kg/m     Wt Readings from Last 3 Encounters:  10/20/22 149 lb 6.4 oz (67.8 kg)  09/13/22 160 lb (72.6 kg)  12/03/20 160 lb 7.9 oz (72.8 kg)     GEN:  Well nourished, well developed in no acute distress HEENT: Normal NECK: No JVD; No carotid bruits LYMPHATICS: No lymphadenopathy CARDIAC: RRR, no murmurs, rubs, gallops RESPIRATORY:  Clear to auscultation without rales, wheezing or  rhonchi  ABDOMEN: Soft, non-tender, non-distended MUSCULOSKELETAL:  No edema; No deformity  SKIN: Warm and dry NEUROLOGIC:  Alert and oriented x 3 PSYCHIATRIC:  Normal affect   ASSESSMENT:    1. Essential hypertension   2. Paroxysmal atrial fibrillation (HCC)   3. Chronic heart failure with preserved ejection fraction (HCC)   4. Hyperlipidemia with target LDL less than 70  PLAN:    In order of problems listed above:  Hypertension - Maintained on 25 mg spironolactone -- recent BMP reassuring   Sinus bradycardia - Asymptomatic - Avoid AV nodal blocking agents, heart rate in the 40s today -- no syncope, slight dizziness about once per month   PAF - In the setting of hyperthyroidism, no recurrence - Not on anticoagulation - Avoid AV nodal agents for baseline bradycardia   Chronic diastolic heart failure - Managed with fluid restriction and low-sodium diet - Also on spironolactone as above - Recent BMP stable with K4.3   Dyslipidemia  - now on crestor 10 mg Lipid panel 09/2022: Total cholesterol 146 HDL 80 LDL 51 Triglycerides 75   Follow-up in 1 year.           Medication Adjustments/Labs and Tests Ordered: Current medicines are reviewed at length with the patient today.  Concerns regarding medicines are outlined above.  Orders Placed This Encounter  Procedures   EKG 12-Lead   No orders of the defined types were placed in this encounter.   Patient Instructions  Medication Instructions:  Your physician recommends that you continue on your current medications as directed. Please refer to the Current Medication list given to you today.  *If you need a refill on your cardiac medications before your next appointment, please call your pharmacy*    Follow-Up: At [facility], you and your health needs are our priority.  As part of our continuing mission to provide you with exceptional heart care, we have created designated Provider Care Teams.   These Care Teams include your primary Cardiologist (physician) and Advanced Practice Providers (APPs -  Physician Assistants and Nurse Practitioners) who all work together to provide you with the care you need, when you need it.  We recommend signing up for the patient portal called "MyChart".  Sign up information is provided on this After Visit Summary.  MyChart is used to connect with patients for Virtual Visits (Telemedicine).  Patients are able to view lab/test results, encounter notes, upcoming appointments, etc.  Non-urgent messages can be sent to your provider as well.   To learn more about what you can do with MyChart, go to [email or web address].    Your next appointment:   12 month(s)  Provider:   Olga Millers, MD  or Micah Flesher, PA-C       Signed, Roe Rutherford [place], [place]  10/20/2022 9:33 AM    [facility]

## 2022-10-12 ENCOUNTER — Ambulatory Visit: Admission: RE | Admit: 2022-10-12 | Payer: 59 | Source: Ambulatory Visit

## 2022-10-12 DIAGNOSIS — Z1231 Encounter for screening mammogram for malignant neoplasm of breast: Secondary | ICD-10-CM | POA: Diagnosis not present

## 2022-10-20 ENCOUNTER — Ambulatory Visit: Payer: 59 | Attending: Physician Assistant | Admitting: Physician Assistant

## 2022-10-20 ENCOUNTER — Encounter: Payer: Self-pay | Admitting: Physician Assistant

## 2022-10-20 VITALS — BP 118/70 | HR 43 | Ht 62.0 in | Wt 149.4 lb

## 2022-10-20 DIAGNOSIS — I10 Essential (primary) hypertension: Secondary | ICD-10-CM

## 2022-10-20 DIAGNOSIS — I480 Paroxysmal atrial fibrillation: Secondary | ICD-10-CM

## 2022-10-20 DIAGNOSIS — E785 Hyperlipidemia, unspecified: Secondary | ICD-10-CM | POA: Diagnosis not present

## 2022-10-20 DIAGNOSIS — I5032 Chronic diastolic (congestive) heart failure: Secondary | ICD-10-CM

## 2022-10-20 NOTE — Patient Instructions (Signed)
Medication Instructions:  Your physician recommends that you continue on your current medications as directed. Please refer to the Current Medication list given to you today.  *If you need a refill on your cardiac medications before your next appointment, please call your pharmacy*    Follow-Up: At [facility], you and your health needs are our priority.  As part of our continuing mission to provide you with exceptional heart care, we have created designated Provider Care Teams.  These Care Teams include your primary Cardiologist (physician) and Advanced Practice Providers (APPs -  Physician Assistants and Nurse Practitioners) who all work together to provide you with the care you need, when you need it.  We recommend signing up for the patient portal called "MyChart".  Sign up information is provided on this After Visit Summary.  MyChart is used to connect with patients for Virtual Visits (Telemedicine).  Patients are able to view lab/test results, encounter notes, upcoming appointments, etc.  Non-urgent messages can be sent to your provider as well.   To learn more about what you can do with MyChart, go to [email or web address].    Your next appointment:   12 month(s)  Provider:   Olga Millers, MD  or Micah Flesher, PA-C

## 2022-12-18 ENCOUNTER — Encounter (HOSPITAL_COMMUNITY): Payer: Self-pay | Admitting: Emergency Medicine

## 2022-12-18 ENCOUNTER — Other Ambulatory Visit: Payer: Self-pay

## 2022-12-18 ENCOUNTER — Emergency Department (HOSPITAL_COMMUNITY): Payer: 59

## 2022-12-18 ENCOUNTER — Emergency Department (HOSPITAL_COMMUNITY)
Admission: EM | Admit: 2022-12-18 | Discharge: 2022-12-18 | Disposition: A | Discharge status: Home / Self Care | Payer: 59 | Attending: Emergency Medicine | Admitting: Emergency Medicine

## 2022-12-18 DIAGNOSIS — I509 Heart failure, unspecified: Secondary | ICD-10-CM | POA: Insufficient documentation

## 2022-12-18 DIAGNOSIS — I480 Paroxysmal atrial fibrillation: Secondary | ICD-10-CM | POA: Diagnosis not present

## 2022-12-18 DIAGNOSIS — I110 Hypertensive heart disease with heart failure: Secondary | ICD-10-CM | POA: Insufficient documentation

## 2022-12-18 DIAGNOSIS — R404 Transient alteration of awareness: Secondary | ICD-10-CM | POA: Diagnosis not present

## 2022-12-18 DIAGNOSIS — K429 Umbilical hernia without obstruction or gangrene: Secondary | ICD-10-CM | POA: Diagnosis not present

## 2022-12-18 DIAGNOSIS — R110 Nausea: Secondary | ICD-10-CM | POA: Diagnosis not present

## 2022-12-18 DIAGNOSIS — W1839XA Other fall on same level, initial encounter: Secondary | ICD-10-CM | POA: Diagnosis not present

## 2022-12-18 DIAGNOSIS — Z85850 Personal history of malignant neoplasm of thyroid: Secondary | ICD-10-CM | POA: Diagnosis not present

## 2022-12-18 DIAGNOSIS — Y92512 Supermarket, store or market as the place of occurrence of the external cause: Secondary | ICD-10-CM | POA: Diagnosis not present

## 2022-12-18 DIAGNOSIS — J449 Chronic obstructive pulmonary disease, unspecified: Secondary | ICD-10-CM | POA: Insufficient documentation

## 2022-12-18 DIAGNOSIS — R100 Acute abdomen: Secondary | ICD-10-CM | POA: Diagnosis present

## 2022-12-18 DIAGNOSIS — Z7982 Long term (current) use of aspirin: Secondary | ICD-10-CM | POA: Diagnosis not present

## 2022-12-18 DIAGNOSIS — R001 Bradycardia, unspecified: Secondary | ICD-10-CM | POA: Insufficient documentation

## 2022-12-18 DIAGNOSIS — R55 Syncope and collapse: Secondary | ICD-10-CM | POA: Insufficient documentation

## 2022-12-18 DIAGNOSIS — Z9181 History of falling: Secondary | ICD-10-CM | POA: Diagnosis present

## 2022-12-18 DIAGNOSIS — R109 Unspecified abdominal pain: Secondary | ICD-10-CM | POA: Diagnosis not present

## 2022-12-18 DIAGNOSIS — R42 Dizziness and giddiness: Secondary | ICD-10-CM | POA: Diagnosis not present

## 2022-12-18 DIAGNOSIS — Z743 Need for continuous supervision: Secondary | ICD-10-CM | POA: Diagnosis not present

## 2022-12-18 LAB — COMPREHENSIVE METABOLIC PANEL
ALT: 37 U/L (ref 0–44)
AST: 32 U/L (ref 15–41)
Albumin: 3.7 g/dL (ref 3.5–5.0)
Alkaline Phosphatase: 56 U/L (ref 38–126)
Anion gap: 9 (ref 5–15)
BUN: 10 mg/dL (ref 8–23)
CO2: 25 mmol/L (ref 22–32)
Calcium: 9.8 mg/dL (ref 8.9–10.3)
Chloride: 103 mmol/L (ref 98–111)
Creatinine, Ser: 1.09 mg/dL — ABNORMAL HIGH (ref 0.44–1.00)
GFR, Estimated: 54 mL/min — ABNORMAL LOW (ref >60)
Glucose, Bld: 139 mg/dL — ABNORMAL HIGH (ref 70–99)
Potassium: 3.6 mmol/L (ref 3.5–5.1)
Sodium: 137 mmol/L (ref 135–145)
Total Bilirubin: 0.9 mg/dL (ref 0.3–1.2)
Total Protein: 7.4 g/dL (ref 6.5–8.1)

## 2022-12-18 LAB — CBC WITH DIFFERENTIAL/PLATELET
Abs Immature Granulocytes: 0.01 10*3/uL (ref 0.00–0.07)
Basophils Absolute: 0 10*3/uL (ref 0.0–0.1)
Basophils Relative: 1 %
Eosinophils Absolute: 0.1 10*3/uL (ref 0.0–0.5)
Eosinophils Relative: 1 %
HCT: 43.7 % (ref 36.0–46.0)
Hemoglobin: 14.7 g/dL (ref 12.0–15.0)
Immature Granulocytes: 0 %
Lymphocytes Relative: 34 %
Lymphs Abs: 2 10*3/uL (ref 0.7–4.0)
MCH: 32 pg (ref 26.0–34.0)
MCHC: 33.6 g/dL (ref 30.0–36.0)
MCV: 95.2 fL (ref 80.0–100.0)
Monocytes Absolute: 0.4 10*3/uL (ref 0.1–1.0)
Monocytes Relative: 7 %
Neutro Abs: 3.3 10*3/uL (ref 1.7–7.7)
Neutrophils Relative %: 57 %
Platelets: 207 10*3/uL (ref 150–400)
RBC: 4.59 MIL/uL (ref 3.87–5.11)
RDW: 13.9 % (ref 11.5–15.5)
WBC: 5.8 10*3/uL (ref 4.0–10.5)
nRBC: 0 % (ref 0.0–0.2)

## 2022-12-18 LAB — URINALYSIS, ROUTINE W REFLEX MICROSCOPIC
Bilirubin Urine: NEGATIVE
Glucose, UA: NEGATIVE mg/dL
Hgb urine dipstick: NEGATIVE
Ketones, ur: NEGATIVE mg/dL
Nitrite: NEGATIVE
Protein, ur: NEGATIVE mg/dL
Specific Gravity, Urine: 1.006 (ref 1.005–1.030)
pH: 6 (ref 5.0–8.0)

## 2022-12-18 LAB — TROPONIN I (HIGH SENSITIVITY)
Troponin I (High Sensitivity): 4 ng/L (ref <18)
Troponin I (High Sensitivity): 5 ng/L (ref <18)

## 2022-12-18 LAB — LIPASE, BLOOD: Lipase: 32 U/L (ref 11–51)

## 2022-12-18 MED ORDER — IOHEXOL 350 MG/ML SOLN
75.0000 mL | Freq: Once | INTRAVENOUS | Status: AC | PRN
  Administered 2022-12-18: 75 mL via INTRAVENOUS

## 2022-12-18 MED ORDER — ONDANSETRON HCL 4 MG/2ML IJ SOLN
4.0000 mg | Freq: Once | INTRAMUSCULAR | Status: AC
  Administered 2022-12-18: 4 mg via INTRAVENOUS
  Filled 2022-12-18: qty 2

## 2022-12-18 NOTE — ED Triage Notes (Addendum)
[place] episode. Per EMS, patient got dizzy and fell in front of a cashier. Per EMS, the bystander stated that the patient did not lose consciousness. Per EMS, patient was initially bradycardic in the 40s and endorsing nausea both of which have improved without intervention from EMS. Patient endorses abdominal pain since this morning.

## 2022-12-18 NOTE — Discharge Instructions (Addendum)
Drink plenty of fluids.  Continue to take your medications.  Follow-up with your doctor to be rechecked.  Return to the ED for any recurrent symptoms

## 2022-12-18 NOTE — ED Provider Notes (Signed)
[place] EMERGENCY DEPARTMENT AT [facility] Provider Note   CSN: [id number] Arrival date & time: 12/18/22  1006     History  Chief Complaint  Patient presents with   Near Syncope    [name] [name] is a 72 y.o. female.   Near Syncope     Patient has a history of hypertension, CHF, bradycardia paroxysmal atrial fibrillation, COPD, thyroid cancer.  Patient presents ED for evaluation after syncopal episode.  Patient states that she was at the grocery store today.  She started to have some generalized malaise.  She felt like she had to have a bowel movement she went to use the bathroom at the grocery store and had a bowel movement.  Patient states she did not have any diarrhea.  She did not notice any blood in her stool.  After walking out of the bathroom she was feeling lightheaded and dizzy.  She asked if she could sit down and the last thing she remembers.  Per bystanders patient did not completely lose consciousness.  EMS noted her to be bradycardic on arrival.  Patient also complained of some nausea.  Patient states she is feeling better now although not completely back to normal.  She is not having any abdominal pain at this time.  No chest pain or shortness of breath.  Home Medications Prior to Admission medications   Medication Sig Start Date End Date Taking? Authorizing Provider  acetaminophen (TYLENOL) 500 MG tablet Take 2 tablets (1,000 mg total) by mouth every 8 (eight) hours as needed for mild pain or moderate pain. 12/03/20   Jenne Pane, PA-C  aspirin EC 81 MG tablet Take 1 tablet (81 mg total) by mouth 2 (two) times daily. For DVT prophylaxis for 30 days after surgery. 12/03/20   Jenne Pane, PA-C  brimonidine (ALPHAGAN) 0.2 % ophthalmic solution Place 1 drop into both eyes 3 (three) times daily. 08/07/20   [provider]  fluticasone (FLONASE) 50 MCG/ACT nasal spray Place 1 spray into both nostrils daily. 10/10/20   [provider]   GEMTESA 75 MG TABS Take 1 tablet by mouth daily. 09/26/22   [provider]  levocetirizine (XYZAL) 5 MG tablet Take 5 mg by mouth every evening.    [provider]  levothyroxine (SYNTHROID) 75 MCG tablet Take 75 mcg by mouth every morning. 09/28/19   [provider]  montelukast (SINGULAIR) 10 MG tablet Take 10 mg by mouth in the morning.    [provider]  Multiple Vitamin (MULTIVITAMIN WITH MINERALS) TABS tablet Take 1 tablet by mouth daily. Centrum Silver for Women    [provider]  rosuvastatin (CRESTOR) 10 MG tablet Take 10 mg by mouth daily. 08/04/22   [provider]  SIMBRINZA 1-0.2 % SUSP Apply 1 drop to eye 3 (three) times daily. 08/24/22   [provider]  spironolactone (ALDACTONE) 25 MG tablet Take 25 mg by mouth every morning. 08/09/20   [provider]  Travoprost, BAK Free, (TRAVATAMN) 0.004 % SOLN ophthalmic solution Place 1 drop into both eyes at bedtime.      [provider]      Allergies    Penicillins    Review of Systems   Review of Systems  Cardiovascular:  Positive for near-syncope.    Physical Exam Updated Vital Signs BP 132/80   Pulse (!) 53   Temp 97.8 F (36.6 C) (Oral)   Resp 14   Ht 1.575 m (5\' 2" )  Wt 67.8 kg   SpO2 100%   BMI 27.34 kg/m  Physical Exam Vitals and nursing note reviewed.  Constitutional:      General: She is not in acute distress.    Appearance: She is well-developed.  HENT:     Head: Normocephalic and atraumatic.     Right Ear: External ear normal.     Left Ear: External ear normal.  Eyes:     General: No scleral icterus.       Right eye: No discharge.        Left eye: No discharge.     Conjunctiva/sclera: Conjunctivae normal.  Neck:     Trachea: No tracheal deviation.  Cardiovascular:     Rate and Rhythm: Normal rate and regular rhythm.  Pulmonary:     Effort: Pulmonary effort is normal. No respiratory distress.     Breath sounds:  Normal breath sounds. No stridor. No wheezing or rales.  Abdominal:     General: Bowel sounds are normal. There is no distension.     Palpations: Abdomen is soft.     Tenderness: There is no abdominal tenderness. There is no guarding or rebound.  Musculoskeletal:        General: No tenderness or deformity.     Cervical back: Neck supple.  Skin:    General: Skin is warm and dry.     Findings: No rash.  Neurological:     General: No focal deficit present.     Mental Status: She is alert.     Cranial Nerves: No cranial nerve deficit, dysarthria or facial asymmetry.     Sensory: No sensory deficit.     Motor: No abnormal muscle tone or seizure activity.     Coordination: Coordination normal.  Psychiatric:        Mood and Affect: Mood normal.     ED Results / Procedures / Treatments   Labs (all labs ordered are listed, but only abnormal results are displayed) Labs Reviewed  COMPREHENSIVE METABOLIC PANEL - Abnormal; Notable for the following components:      Result Value   Glucose, Bld 139 (*)    Creatinine, Ser 1.09 (*)    GFR, Estimated 54 (*)    All other components within normal limits  URINALYSIS, ROUTINE W REFLEX MICROSCOPIC - Abnormal; Notable for the following components:   Leukocytes,Ua TRACE (*)    Bacteria, UA RARE (*)    All other components within normal limits  LIPASE, BLOOD  CBC WITH DIFFERENTIAL/PLATELET  TROPONIN I (HIGH SENSITIVITY)  TROPONIN I (HIGH SENSITIVITY)    EKG EKG Interpretation Date/Time:  Friday December 18 2022 10:14:18 EDT Ventricular Rate:  52 PR Interval:  180 QRS Duration:  67 QT Interval:  666 QTC Calculation: 620 R Axis:   -4  Text Interpretation: Sinus rhythm Abnormal R-wave progression, early transition Borderline T abnormalities, anterior leads Prolonged QT interval No significant change since last tracing Confirmed by Linwood Dibbles [phone]) on 12/18/2022 10:19:20 AM  Radiology CT ABDOMEN PELVIS W CONTRAST  Result Date:  12/18/2022 CLINICAL DATA:  Abdominal pain, acute, nonlocalized EXAM: CT ABDOMEN AND PELVIS WITH CONTRAST TECHNIQUE: Multidetector CT imaging of the abdomen and pelvis was performed using the standard protocol following bolus administration of intravenous contrast. RADIATION DOSE REDUCTION: This exam was performed according to the departmental dose-optimization program which includes automated exposure control, adjustment of the mA and/or kV according to patient size and/or use of iterative reconstruction technique. CONTRAST:  75mL OMNIPAQUE IOHEXOL 350 MG/ML SOLN COMPARISON:  None Available. FINDINGS: Lower chest: There are subpleural atelectatic changes in the visualized lung bases. No overt consolidation. No pleural effusion. The heart is normal in size. No pericardial effusion. Hepatobiliary: The liver is normal in size. Non-cirrhotic configuration. No suspicious mass. No intrahepatic or extrahepatic bile duct dilation. No calcified gallstones. Normal gallbladder wall thickness. No pericholecystic inflammatory changes. Pancreas: Unremarkable. No pancreatic ductal dilatation or surrounding inflammatory changes. Spleen: Within normal limits. No focal lesion. Adrenals/Urinary Tract: Adrenal glands are unremarkable. No suspicious renal mass. No hydronephrosis. No renal or ureteric calculi. Unremarkable urinary bladder. Stomach/Bowel: No disproportionate dilation of the small or large bowel loops. No evidence of abnormal bowel wall thickening or inflammatory changes. The appendix is unremarkable. Vascular/Lymphatic: No ascites or pneumoperitoneum. No abdominal or pelvic lymphadenopathy, by size criteria. No aneurysmal dilation of the major abdominal arteries. Reproductive: The uterus is surgically absent. No large adnexal mass. Other: There is a tiny fat containing umbilical hernia. The soft tissues and abdominal wall are otherwise unremarkable. Musculoskeletal: No suspicious osseous lesions. There are mild  multilevel degenerative changes in the visualized spine. IMPRESSION: 1. No acute inflammatory process identified within the abdomen or pelvis. 2. Multiple other observations, as described above. Electronically Signed   By: Jules Schick M.D.   On: 12/18/2022 15:59    Procedures Procedures    Medications Ordered in ED Medications  ondansetron (ZOFRAN) injection 4 mg (4 mg Intravenous Given 12/18/22 1042)  iohexol (OMNIPAQUE) 350 MG/ML injection 75 mL (75 mLs Intravenous Contrast Given 12/18/22 1401)    ED Course/ Medical Decision Making/ A&P Clinical Course as of 12/18/22 1609  Fri Dec 18, 2022  1222 CBC with Diff CBC normal [JK]  1236 Metabolic panel normal.  Lipase normal.  Troponin normal [JK]  1606 CT scan without acute findings [JK]    Clinical Course User Index [JK] Linwood Dibbles, MD                                 Medical Decision Making Problems Addressed: Near syncope: acute illness or injury that poses a threat to life or bodily functions  Amount and/or Complexity of Data Reviewed Labs: ordered. Decision-making details documented in ED Course. Radiology: ordered and independent interpretation performed.  Risk Prescription drug management.   Patient presented to the ED for evaluation after syncopal episode.  Patient states that she had gone to the bathroom started to feel lightheaded nauseated afterwards.  Patient in the ED was not having any trouble with any chest pain or shortness of breath.  She did have some mild abdominal discomfort.  ED workup did not show any signs of anemia.  No signs of any severe dehydration or electrolyte abnormalities.  Urinalysis not suggestive of infection.  EKG showed a sinus bradycardia but patient was asymptomatic.  No signs of hypotension in the ED.  CT scan was performed and no signs of any acute abnormality.  Question possible vasovagal symptom.  Evaluation and diagnostic testing in the emergency department does not suggest an emergent  condition requiring admission or immediate intervention beyond what has been performed at this time.  The patient is safe for discharge and has been instructed to return immediately for worsening symptoms, change in symptoms or any other concerns.         Final Clinical Impression(s) / ED Diagnoses Final diagnoses:  Near syncope    Rx / DC Orders ED Discharge Orders     None  Linwood Dibbles, MD 12/18/22 [phone]

## 2022-12-22 DIAGNOSIS — I5032 Chronic diastolic (congestive) heart failure: Secondary | ICD-10-CM | POA: Diagnosis not present

## 2022-12-22 DIAGNOSIS — R001 Bradycardia, unspecified: Secondary | ICD-10-CM | POA: Diagnosis not present

## 2022-12-24 DIAGNOSIS — H401131 Primary open-angle glaucoma, bilateral, mild stage: Secondary | ICD-10-CM | POA: Diagnosis not present

## 2022-12-28 DIAGNOSIS — R001 Bradycardia, unspecified: Secondary | ICD-10-CM | POA: Diagnosis not present

## 2023-01-14 ENCOUNTER — Encounter: Payer: Self-pay | Admitting: Nurse Practitioner

## 2023-01-14 ENCOUNTER — Ambulatory Visit: Payer: 59 | Attending: Nurse Practitioner | Admitting: Nurse Practitioner

## 2023-01-14 VITALS — HR 50 | Ht 62.0 in | Wt 152.6 lb

## 2023-01-14 DIAGNOSIS — R001 Bradycardia, unspecified: Secondary | ICD-10-CM | POA: Diagnosis not present

## 2023-01-14 DIAGNOSIS — I5032 Chronic diastolic (congestive) heart failure: Secondary | ICD-10-CM

## 2023-01-14 DIAGNOSIS — I480 Paroxysmal atrial fibrillation: Secondary | ICD-10-CM | POA: Diagnosis not present

## 2023-01-14 DIAGNOSIS — E785 Hyperlipidemia, unspecified: Secondary | ICD-10-CM | POA: Diagnosis not present

## 2023-01-14 DIAGNOSIS — R42 Dizziness and giddiness: Secondary | ICD-10-CM | POA: Diagnosis not present

## 2023-01-14 DIAGNOSIS — Z87898 Personal history of other specified conditions: Secondary | ICD-10-CM

## 2023-01-14 DIAGNOSIS — I10 Essential (primary) hypertension: Secondary | ICD-10-CM | POA: Diagnosis not present

## 2023-01-14 DIAGNOSIS — I951 Orthostatic hypotension: Secondary | ICD-10-CM

## 2023-01-14 NOTE — Patient Instructions (Addendum)
Medication Instructions:  Your physician recommends that you continue on your current medications as directed. Please refer to the Current Medication list given to you today.  *If you need a refill on your cardiac medications before your next appointment, please call your pharmacy*   Lab Work: NONE ordered at this time of appointment    Testing/Procedures: Your physician has requested that you have an echocardiogram. Echocardiography is a painless test that uses sound waves to create images of your heart. It provides your doctor with information about the size and shape of your heart and how well your heart's chambers and valves are working. This procedure takes approximately one hour. There are no restrictions for this procedure. Please do NOT wear cologne, perfume, aftershave, or lotions (deodorant is allowed). Please arrive 15 minutes prior to your appointment time.  Please note: We ask at that you not bring children with you during ultrasound (echo/ vascular) testing. Due to room size and safety concerns, children are not allowed in the ultrasound rooms during exams. Our front office staff cannot provide observation of children in our lobby area while testing is being conducted. An adult accompanying a patient to their appointment will only be allowed in the ultrasound room at the discretion of the ultrasound technician under special circumstances. We apologize for any inconvenience.  Your physician has recommended that you wear an event monitor. Event monitors are medical devices that record the heart's electrical activity. Doctors most often [place] these monitors to diagnose arrhythmias. Arrhythmias are problems with the speed or rhythm of the heartbeat. The monitor is a small, portable device. You can wear one while you do your normal daily activities. This is usually used to diagnose what is causing palpitations/syncope (passing out).   Your physician has requested that you have a carotid  duplex. This test is an ultrasound of the carotid arteries in your neck. It looks at blood flow through these arteries that supply the brain with blood. Allow one hour for this exam. There are no restrictions or special instructions.     Follow-Up: At [facility], you and your health needs are our priority.  As part of our continuing mission to provide you with exceptional heart care, we have created designated Provider Care Teams.  These Care Teams include your primary Cardiologist (physician) and Advanced Practice Providers (APPs -  Physician Assistants and Nurse Practitioners) who all work together to provide you with the care you need, when you need it.  We recommend signing up for the patient portal called "MyChart".  Sign up information is provided on this After Visit Summary.  MyChart is used to connect with patients for Virtual Visits (Telemedicine).  Patients are able to view lab/test results, encounter notes, upcoming appointments, etc.  Non-urgent messages can be sent to your provider as well.   To learn more about what you can do with MyChart, go to [email or web address].    Your next appointment:   2 month(s)  Provider:   Olga Millers, MD  or Bernadene Person, NP        Other Instructions [employer] Cardiac Event Monitor Instructions  Your physician has requested you wear your cardiac event monitor for _____ days, (1-30). [employer] may call or text to confirm a shipping address. The monitor will be sent to a land address via UPS. [employer] will not ship a monitor to a PO BOX. It typically takes 3-5 days to receive your monitor after it has been enrolled. [employer] will assist with USPS tracking if  your package is delayed. The telephone number for [employer] is [phone]. Once you have received your monitor, please review the enclosed instructions. Instruction tutorials can also be viewed under help and settings on the enclosed cell phone. Your monitor has  already been registered assigning a specific monitor serial # to you.  Billing and Self Pay Discount Information  [employer] has been provided the insurance information we had on file for you.  If your insurance has been updated, please call [employer] at [phone] to provide them with your updated insurance information.   [employer] offers a discounted Self Pay option for patients who have insurance that does not cover their cardiac event monitor or patients without insurance.  The discounted cost of a Self Pay Cardiac Event Monitor would be $225.00 , if the patient contacts [employer] at [phone] within 7 days of applying the monitor to make payment arrangements.  If the patient does not contact [employer] within 7 days of applying the monitor, the cost of the cardiac event monitor will be $350.00.  Applying the monitor  Remove cell phone from case and turn it on. The cell phone works as IT consultant and needs to be within [employer] of you at all times. The cell phone will need to be charged on a daily basis. We recommend you plug the cell phone into the enclosed charger at your bedside table every night.  Monitor batteries: You will receive two monitor batteries labelled #1 and #2. These are your recorders. Plug battery #2 onto the second connection on the enclosed charger. Keep one battery on the charger at all times. This will keep the monitor battery deactivated. It will also keep it fully charged for when you need to switch your monitor batteries. A small light will be blinking on the battery emblem when it is charging. The light on the battery emblem will remain on when the battery is fully charged.  Open package of a Monitor strip. Insert battery #1 into black hood on strip and gently squeeze monitor battery onto connection as indicated in instruction booklet. Set aside while preparing skin.  Choose location for your strip, vertical or horizontal, as indicated in the  instruction booklet. Shave to remove all hair from location. There cannot be any lotions, oils, powders, or colognes on skin where monitor is to be applied. Wipe skin clean with enclosed Saline wipe. Dry skin completely.  Peel paper labeled #1 off the back of the Monitor strip exposing the adhesive. Place the monitor on the chest in the vertical or horizontal position shown in the instruction booklet. One arrow on the monitor strip must be pointing upward. Carefully remove paper labeled #2, attaching remainder of strip to your skin. Try not to create any folds or wrinkles in the strip as you apply it.  Firmly press and release the circle in the center of the monitor battery. You will hear a small beep. This is turning the monitor battery on. The heart emblem on the monitor battery will light up every 5 seconds if the monitor battery in turned on and connected to the patient securely. Do not push and hold the circle down as this turns the monitor battery off. The cell phone will locate the monitor battery. A screen will appear on the cell phone checking the connection of your monitor strip. This may read poor connection initially but change to good connection within the next minute. Once your monitor accepts the connection you will hear a series of 3 beeps followed  by a climbing crescendo of beeps. A screen will appear on the cell phone showing the two monitor strip placement options. Touch the picture that demonstrates where you applied the monitor strip.  Your monitor strip and battery are waterproof. You are able to shower, bathe, or swim with the monitor on. They just ask you do not submerge deeper than 3 feet underwater. We recommend removing the monitor if you are swimming in a lake, river, or ocean.  Your monitor battery will need to be switched to a fully charged monitor battery approximately once a week. The cell phone will alert you of an action which needs to be made.  On the cell  phone, tap for details to reveal connection status, monitor battery status, and cell phone battery status. The green dots indicates your monitor is in good status. A red dot indicates there is something that needs your attention.  To record a symptom, click the circle on the monitor battery. In 30-60 seconds a list of symptoms will appear on the cell phone. Select your symptom and tap save. Your monitor will record a sustained or significant arrhythmia regardless of you clicking the button. Some patients do not feel the heart rhythm irregularities. [employer] will notify us of any serious or critical events.  Refer to instruction booklet for instructions on switching batteries, changing strips, the Do not disturb or Pause features, or any additional questions.  Call [employer] at [phone], to confirm your monitor is transmitting and record your baseline. They will answer any questions you may have regarding the monitor instructions at that time.  Returning the monitor to [employer]  Place all equipment back into blue box. Peel off strip of paper to expose adhesive and close box securely. There is a prepaid UPS shipping label on this box. Drop in a UPS drop box, or at a UPS facility like [employer]. You may also contact [employer] to arrange UPS to pick up monitor package at your home.

## 2023-01-14 NOTE — Progress Notes (Signed)
Office Visit    Patient Name: [name] [name] Date of Encounter: 01/14/2023  Primary Care Provider:  Aliene Beams, MD Primary Cardiologist:  Olga Millers, MD  Chief Complaint    72 year old female with a history of chronic diastolic heart failure, paroxysmal atrial fibrillation, bradycardia, hypertension, dyslipidemia, and hyperthyroidism s/p thyroidectomy with subsequent hypothyroidism who presents for follow-up related to near syncope.  Past Medical History    Past Medical History:  Diagnosis Date   ATRIAL FIBRILLATION, PAROXYSMAL    BRADYCARDIA    Cancer (HCC)    hx of thyroid cancer   Cataract    not a surgical candidate at this time (08/22/2020)   Congestive heart failure, unspecified    COPD    Depression    not on meds at this time   HYPERTENSION    HYPOTHYROIDISM    on meds   OSTEOARTHRITIS    bilateral knees   Seasonal allergies    Past Surgical History:  Procedure Laterality Date   THYROIDECTOMY     TOTAL KNEE ARTHROPLASTY Left 12/03/2020   Procedure: TOTAL KNEE ARTHROPLASTY;  Surgeon: Sheral Apley, MD;  Location: [facility];  Service: Orthopedics;  Laterality: Left;   VAGINAL HYSTERECTOMY     WISDOM TOOTH EXTRACTION      Allergies  Allergies  Allergen Reactions   Penicillins Nausea And Vomiting     Labs/Other Studies Reviewed    The following studies were reviewed today:     Recent Labs: 12/18/2022: ALT 37; BUN 10; Creatinine, Ser 1.09; Hemoglobin 14.7; Platelets 207; Potassium 3.6; Sodium 137  Recent Lipid Panel    Component Value Date/Time   CHOL 176 07/28/2007 2048   TRIG 120 07/28/2007 2048   HDL 62 07/28/2007 2048   CHOLHDL 2.8 Ratio 07/28/2007 2048   VLDL 24 07/28/2007 2048   LDLCALC 90 07/28/2007 2048    History of Present Illness    72 year old female with the above past medical history including chronic diastolic heart failure, paroxysmal atrial fibrillation, bradycardia, hypertension, dyslipidemia, and hyperthyroidism  s/p thyroidectomy with subsequent hypothyroidism.  Echocardiogram in 2017 showed normal LV function, trivial MR/TR.  Nuclear stress test in 12/2009 showed soft tissue attenuation, no ischemia.  She was last seen in the office on 10/20/2022 and was doing well from a cardiac standpoint.  She denied symptoms concerning for angina.  She presented to the ED on 12/18/2022 following a near syncopal episode. This occurred following a bowel movement.  She was noted to be bradycardic. Labs were unremarkable. Her symptoms were concerning for possible vasovagal syncope. She was discharged home in stable condition.  She presents today for follow-up accompanied by her friend. Since her last visit and since her recent ED visit he has been stable overall from a cardiac standpoint.  She has noted ongoing intermittent dizziness with position changes, denies any presyncope or syncope. She denies any palpitations, dyspnea, edema, PND, orthopnea, weight gain. She does note some generalized fatigue.  Home Medications    Current Outpatient Medications  Medication Sig Dispense Refill   acetaminophen (TYLENOL) 500 MG tablet Take 2 tablets (1,000 mg total) by mouth every 8 (eight) hours as needed for mild pain or moderate pain. 60 tablet 0   brimonidine (ALPHAGAN) 0.2 % ophthalmic solution Place 1 drop into both eyes 3 (three) times daily.     fluticasone (FLONASE) 50 MCG/ACT nasal spray Place 1 spray into both nostrils daily.     gabapentin (NEURONTIN) 800 MG tablet Take 800 mg by mouth  3 (three) times daily.     GEMTESA 75 MG TABS Take 1 tablet by mouth daily.     levocetirizine (XYZAL) 5 MG tablet Take 5 mg by mouth every evening.     levothyroxine (SYNTHROID) 75 MCG tablet Take 75 mcg by mouth every morning.     montelukast (SINGULAIR) 10 MG tablet Take 10 mg by mouth in the morning.     Multiple Vitamin (MULTIVITAMIN WITH MINERALS) TABS tablet Take 1 tablet by mouth daily. Centrum Silver for Women     rosuvastatin  (CRESTOR) 10 MG tablet Take 10 mg by mouth daily.     SIMBRINZA 1-0.2 % SUSP Apply 1 drop to eye 3 (three) times daily.     spironolactone (ALDACTONE) 25 MG tablet Take 25 mg by mouth every morning.     Travoprost, BAK Free, (TRAVATAMN) 0.004 % SOLN ophthalmic solution Place 1 drop into both eyes at bedtime.       aspirin EC 81 MG tablet Take 1 tablet (81 mg total) by mouth 2 (two) times daily. For DVT prophylaxis for 30 days after surgery. (Patient not taking: Reported on 01/14/2023) 60 tablet 0   No current facility-administered medications for this visit.     Review of Systems    She denies chest pain, palpitations, dyspnea, pnd, orthopnea, n, v, syncope, edema, weight gain, or early satiety. All other systems reviewed and are otherwise negative except as noted above.   Physical Exam    VS: Orthostatic VS for the past 24 hrs (Last 3 readings):  BP- Lying Pulse- Lying BP- Sitting Pulse- Sitting BP- Standing at 0 minutes Pulse- Standing at 0 minutes BP- Standing at 3 minutes Pulse- Standing at 3 minutes  01/14/23 1610 132/81 53 123/76 53 117/77 53 127/81 57  01/14/23 1600 132/81 53 123/76 53 117/77 53 127/81 57    Pulse (!) 50   Ht 5\' 2"  (1.575 m)   Wt 152 lb 9.6 oz (69.2 kg)   SpO2 96%   BMI 27.91 kg/m   GEN: Well nourished, well developed, in no acute distress. HEENT: normal. Neck: Supple, no JVD, carotid bruits, or masses. Cardiac: RRR, no murmurs, rubs, or gallops. No clubbing, cyanosis, edema.  Radials/DP/PT 2+ and equal bilaterally.  Respiratory:  Respirations regular and unlabored, clear to auscultation bilaterally. GI: Soft, nontender, nondistended, BS + x 4. MS: no deformity or atrophy. Skin: warm and dry, no rash. Neuro:  Strength and sensation are intact. Psych: Normal affect.  Accessory Clinical Findings    ECG personally reviewed by me today - EKG Interpretation Date/Time:  Thursday January 14 2023 15:34:56 EST Ventricular Rate:  50 PR Interval:  184 QRS  Duration:  74 QT Interval:  594 QTC Calculation: 541 R Axis:   -23  Text Interpretation: Sinus bradycardia Low voltage QRS Nonspecific T wave abnormality When compared with ECG of 18-Dec-2022 10:14, PREVIOUS ECG IS PRESENT Confirmed by Bernadene Person ([id number]) on 01/14/2023 3:40:35 PM  - no acute changes.   Lab Results  Component Value Date   WBC 5.8 12/18/2022   HGB 14.7 12/18/2022   HCT 43.7 12/18/2022   MCV 95.2 12/18/2022   PLT 207 12/18/2022   Lab Results  Component Value Date   CREATININE 1.09 (H) 12/18/2022   BUN 10 12/18/2022   NA 137 12/18/2022   K 3.6 12/18/2022   CL 103 12/18/2022   CO2 25 12/18/2022   Lab Results  Component Value Date   ALT 37 12/18/2022   AST 32 12/18/2022  ALKPHOS 56 12/18/2022   BILITOT 0.9 12/18/2022   Lab Results  Component Value Date   CHOL 176 07/28/2007   HDL 62 07/28/2007   LDLCALC 90 07/28/2007   TRIG 120 07/28/2007   CHOLHDL 2.8 Ratio 07/28/2007    No results found for: "HGBA1C"  Assessment & Plan    1.  Syncope/orthostatic hypotension/hypertension: Occurred following a bowel movement.  She was noted to be bradycardic (hx of bradycardia as below). Labs were unremarkable. Symptoms were concerning for possible vasovagal syncope.  Orthostatics borderline positive in office today. BP generally well-controlled, will defer any changes to antihypertensive regimen.  However, if orthostatic dizziness persists, may need to consider decreasing spironolactone and allow for permissive hypertension. Will update echo, will pursue 30-day event monitor, will check carotid ultrasound.  Reviewed driving restrictions, ED precautions, adequate hydration, gradual position changes.     2. Paroxysmal atrial fibrillation/bradycardia: Occurred in the setting of hyperthyroidism. No documented recurrence,not on anticoagulation.  She does note generalized fatigue, dizziness, recent syncope as above. 30-day monitor pending as above. She is not on any AV nodal  blocking agents at this time.  3. Chronic diastolic heart failure: Echocardiogram in 2017 showed normal LV function, trivial MR/TR. Euvolemic and well compensated on exam.  Repeat echo pending.  Managed with fluid restriction and low-sodium diet.  4. Dyslipidemia: LDL was 51 in 09/2022.  Continue Crestor.  5. Hypothyroidism: History of hyperthyroidism s/p thyroidectomy with subsequent hypothyroidism.   6. Disposition: Follow-up in 2 months.   Joylene Grapes, NP 01/14/2023, 5:09 PM

## 2023-01-23 DIAGNOSIS — I5032 Chronic diastolic (congestive) heart failure: Secondary | ICD-10-CM | POA: Diagnosis not present

## 2023-01-23 DIAGNOSIS — I480 Paroxysmal atrial fibrillation: Secondary | ICD-10-CM | POA: Diagnosis not present

## 2023-01-23 DIAGNOSIS — Z87898 Personal history of other specified conditions: Secondary | ICD-10-CM | POA: Diagnosis not present

## 2023-01-23 DIAGNOSIS — R42 Dizziness and giddiness: Secondary | ICD-10-CM | POA: Diagnosis not present

## 2023-02-02 ENCOUNTER — Ambulatory Visit (HOSPITAL_COMMUNITY)
Admission: RE | Admit: 2023-02-02 | Discharge: 2023-02-02 | Disposition: A | Discharge status: Home / Self Care | Payer: 59 | Source: Ambulatory Visit | Attending: Internal Medicine | Admitting: Internal Medicine

## 2023-02-02 DIAGNOSIS — R42 Dizziness and giddiness: Secondary | ICD-10-CM | POA: Diagnosis not present

## 2023-02-02 DIAGNOSIS — Z87898 Personal history of other specified conditions: Secondary | ICD-10-CM | POA: Diagnosis not present

## 2023-02-08 ENCOUNTER — Telehealth: Payer: Self-pay | Admitting: *Deleted

## 2023-02-08 NOTE — Telephone Encounter (Signed)
Patient called and need help putting on heart monitor

## 2023-02-09 ENCOUNTER — Encounter: Payer: Self-pay | Admitting: Cardiology

## 2023-02-09 NOTE — Telephone Encounter (Signed)
Scheduled patient to come to our office on 12/5 @ 9am to have monitor applied

## 2023-02-09 NOTE — Telephone Encounter (Signed)
Error

## 2023-02-11 ENCOUNTER — Ambulatory Visit: Payer: 59 | Attending: Cardiovascular Disease

## 2023-02-11 ENCOUNTER — Telehealth: Payer: Self-pay

## 2023-02-11 DIAGNOSIS — I5032 Chronic diastolic (congestive) heart failure: Secondary | ICD-10-CM

## 2023-02-11 DIAGNOSIS — Z87898 Personal history of other specified conditions: Secondary | ICD-10-CM

## 2023-02-11 DIAGNOSIS — I480 Paroxysmal atrial fibrillation: Secondary | ICD-10-CM

## 2023-02-11 DIAGNOSIS — R42 Dizziness and giddiness: Secondary | ICD-10-CM

## 2023-02-11 NOTE — Progress Notes (Unsigned)
[employer] event monitor serial # [id number] mailed to patient 01/15/23, reapplied in office. [name] [name] at [employer] contacted to arrange 5 day extension.  Patient started monitor on 01/23/23. EOS date will be 02/26/23/

## 2023-02-11 NOTE — Telephone Encounter (Signed)
Left a detailed message on pts machine. Pt advised to call back with any questions or concerns.

## 2023-02-15 ENCOUNTER — Ambulatory Visit: Payer: 59 | Attending: Cardiology

## 2023-02-19 ENCOUNTER — Ambulatory Visit: Payer: 59 | Attending: Cardiovascular Disease

## 2023-02-23 ENCOUNTER — Ambulatory Visit: Payer: 59 | Attending: Internal Medicine

## 2023-02-25 ENCOUNTER — Ambulatory Visit: Payer: 59

## 2023-02-25 ENCOUNTER — Ambulatory Visit (HOSPITAL_COMMUNITY): Payer: 59 | Attending: Cardiology

## 2023-02-25 DIAGNOSIS — R42 Dizziness and giddiness: Secondary | ICD-10-CM | POA: Diagnosis not present

## 2023-02-25 DIAGNOSIS — Z87898 Personal history of other specified conditions: Secondary | ICD-10-CM | POA: Diagnosis not present

## 2023-02-25 DIAGNOSIS — I5032 Chronic diastolic (congestive) heart failure: Secondary | ICD-10-CM | POA: Diagnosis not present

## 2023-02-25 DIAGNOSIS — I480 Paroxysmal atrial fibrillation: Secondary | ICD-10-CM | POA: Insufficient documentation

## 2023-02-25 LAB — ECHOCARDIOGRAM COMPLETE
Area-P 1/2: 3.92 cm2
S' Lateral: 2.9 cm

## 2023-03-02 ENCOUNTER — Telehealth: Payer: Self-pay

## 2023-03-02 NOTE — Telephone Encounter (Addendum)
Lmom to discuss echo results. Mailing results to pt. Pt advised to call back if she has any questions or concerns.

## 2023-03-23 ENCOUNTER — Ambulatory Visit: Payer: 59 | Admitting: Nurse Practitioner

## 2023-04-16 ENCOUNTER — Ambulatory Visit: Payer: 59 | Attending: Nurse Practitioner | Admitting: Emergency Medicine

## 2023-04-16 ENCOUNTER — Encounter: Payer: Self-pay | Admitting: Nurse Practitioner

## 2023-04-16 VITALS — BP 126/74 | HR 50 | Ht 62.0 in | Wt 154.6 lb

## 2023-04-16 DIAGNOSIS — R001 Bradycardia, unspecified: Secondary | ICD-10-CM

## 2023-04-16 DIAGNOSIS — I480 Paroxysmal atrial fibrillation: Secondary | ICD-10-CM | POA: Diagnosis not present

## 2023-04-16 DIAGNOSIS — E039 Hypothyroidism, unspecified: Secondary | ICD-10-CM

## 2023-04-16 DIAGNOSIS — E785 Hyperlipidemia, unspecified: Secondary | ICD-10-CM

## 2023-04-16 DIAGNOSIS — Z87898 Personal history of other specified conditions: Secondary | ICD-10-CM | POA: Diagnosis not present

## 2023-04-16 DIAGNOSIS — I5032 Chronic diastolic (congestive) heart failure: Secondary | ICD-10-CM

## 2023-04-16 IMAGING — MG MM DIGITAL SCREENING BILAT W/ TOMO AND CAD
6 of 10 series · 6 of 30 positions shown · non-contrast
Comparison: Previous exam(s).

ACR Breast Density Category a: The breast tissue is almost entirely
fatty.

CLINICAL DATA: Screening.

EXAM:
DIGITAL SCREENING BILATERAL MAMMOGRAM WITH TOMOSYNTHESIS AND CAD
TECHNIQUE: Bilateral screening digital craniocaudal and mediolateral oblique
mammograms were obtained. Bilateral screening digital breast
tomosynthesis was performed. The images were evaluated with
computer-aided detection.

[R CC synth-2D]
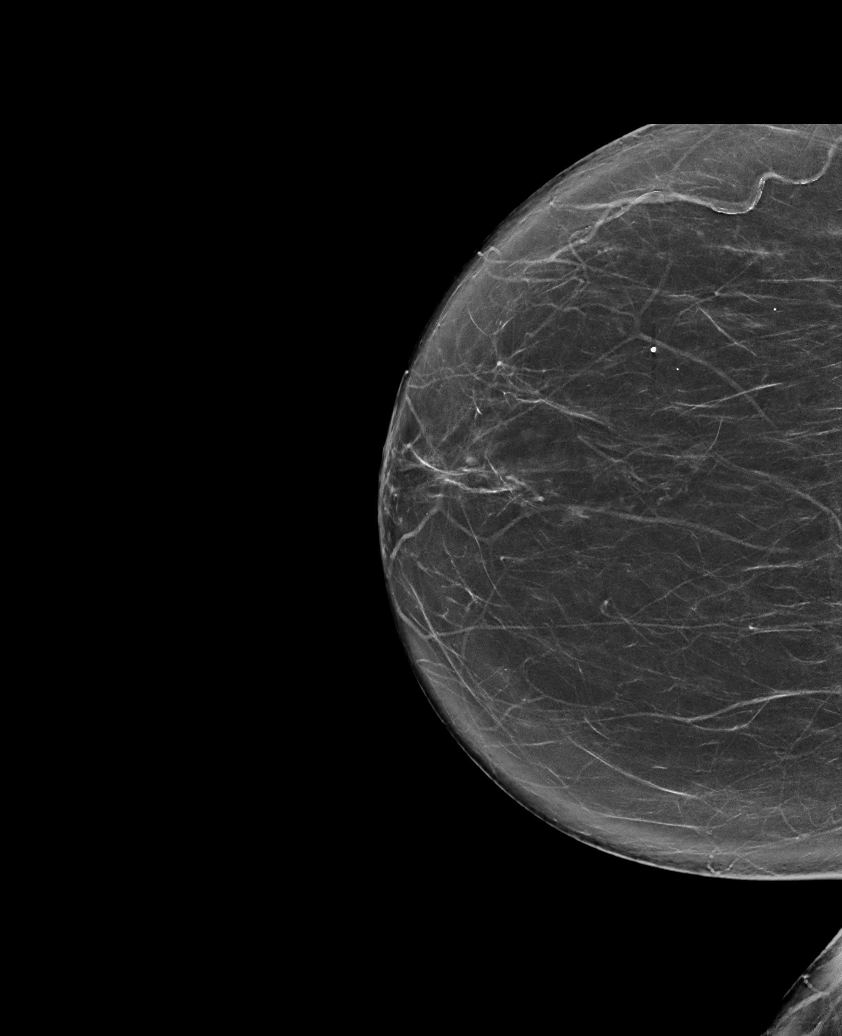

[L MLO synth-2D (1 of 2)]
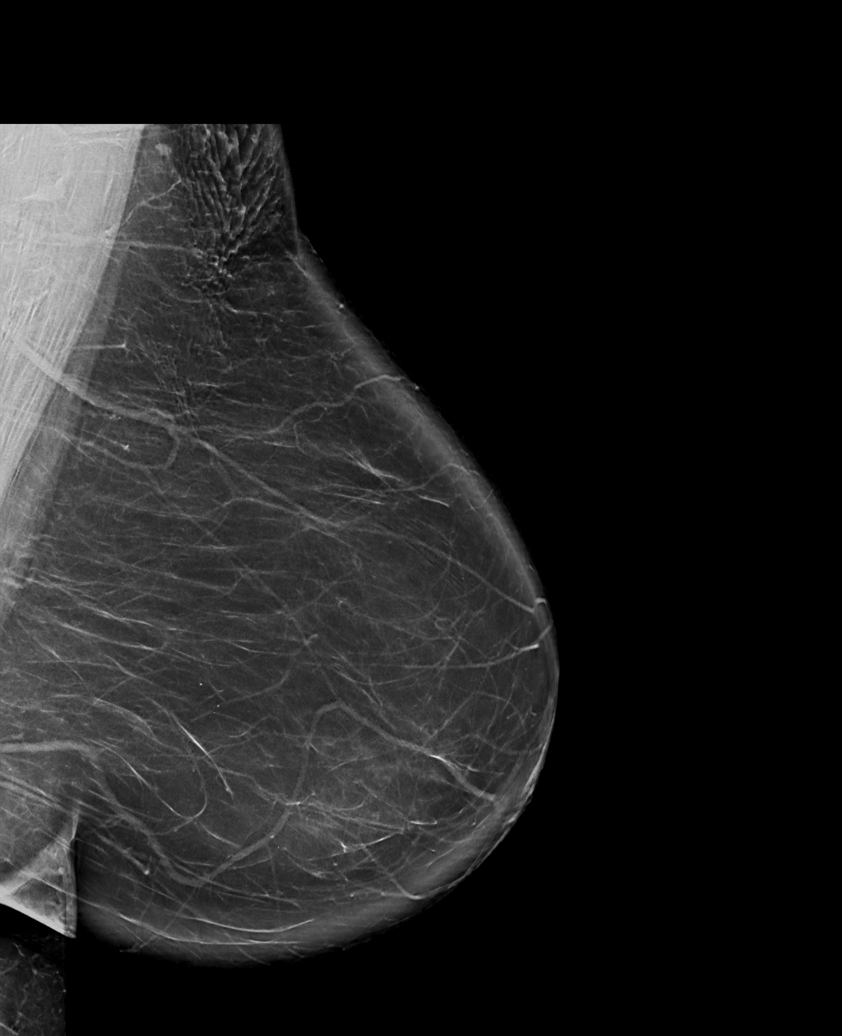

[L MLO synth-2D (2 of 2)]
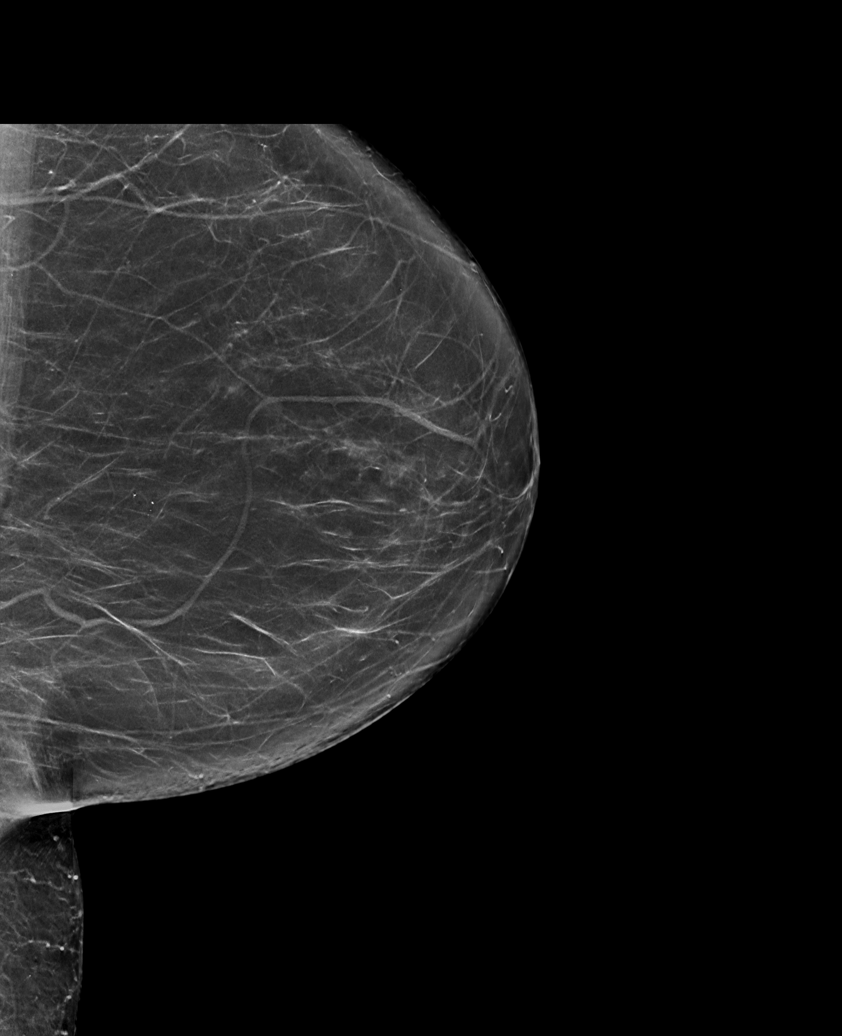

[R MLO synth-2D]
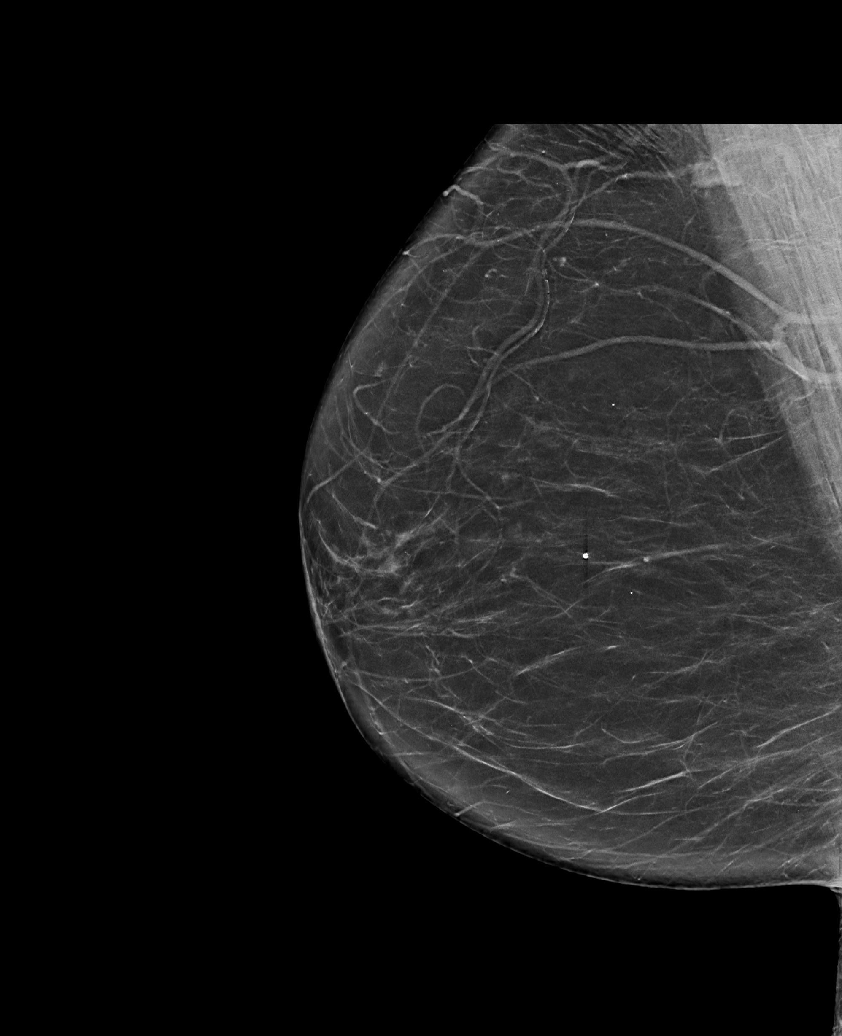

[L CC synth-2D]
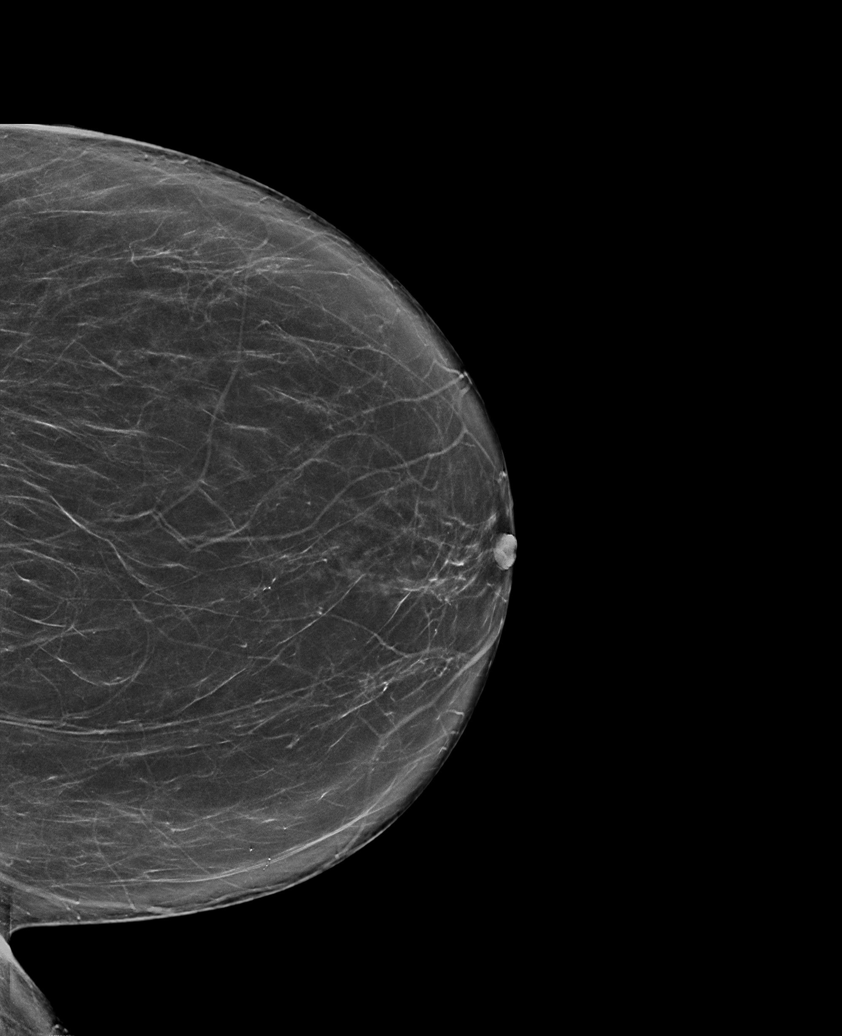

[R MLO tomo · tomo slice 43/84.0]
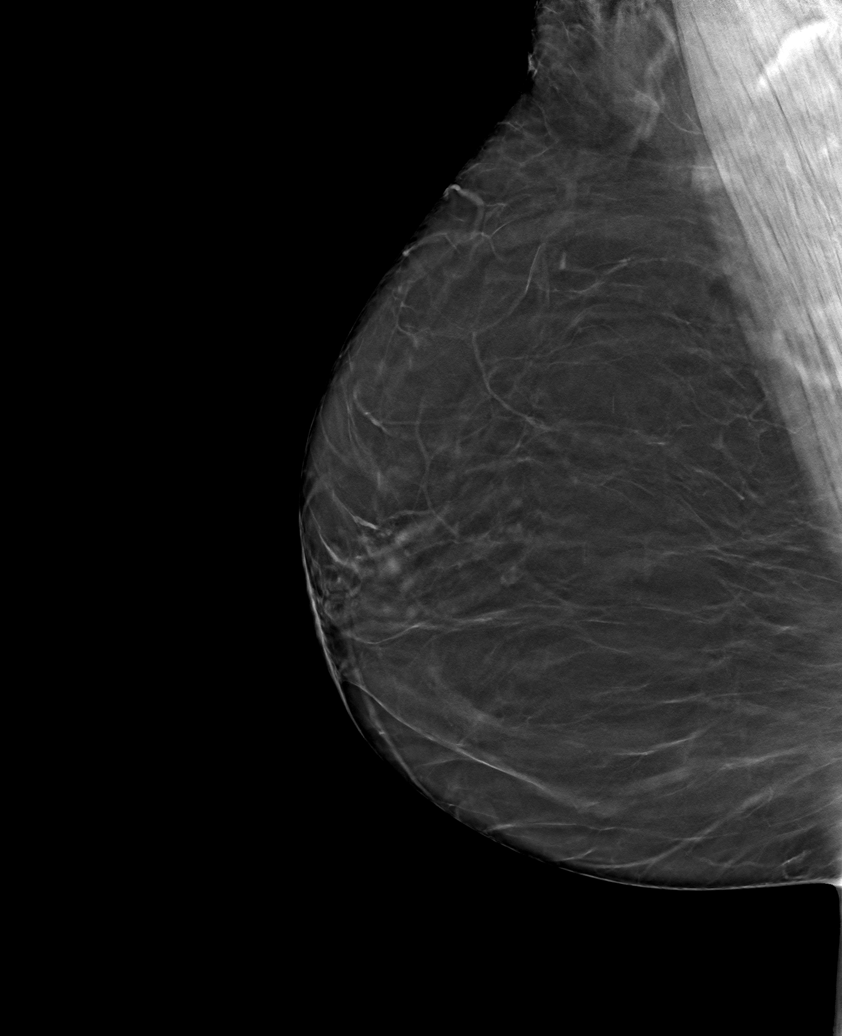

[6 of 30 positions shown; findings below may reference images not displayed]

FINDINGS: There are no findings suspicious for malignancy.
IMPRESSION: No mammographic evidence of malignancy. A result letter of this
screening mammogram will be mailed directly to the patient.

RECOMMENDATION:
Screening mammogram in one year. (Code:0E-3-N98)

BI-RADS CATEGORY  1: Negative.

## 2023-04-16 NOTE — Progress Notes (Signed)
 Cardiology Office Note:    Date:  04/16/2023  ID:  Reagann, Dolce [birth year], MRN [id number] PCP: Rolinda Millman, MD  [facility] Providers Cardiologist:  Redell Shallow, MD Cardiology APP:  Madie Jon Garre, PA       Patient Profile:      [name] [name] is a 73 y.o. female with visit-pertinent history of chronic diastolic heart failure, paroxysmal atrial fibrillation in the setting of hyperthyroidism, bradycardia, hypertension, dyslipidemia, hyperthyroidism s/p thyroidectomy with subsequent hypothyroidism.  Echocardiogram 2017 showed normal LV function, trivial MR/TR.  Nuclear stress test in October 2011 showed soft tissue attenuation, no ischemia.  She presented to the ED on October 2024 following a near syncope episode that occurred following a bowel movement.  She was noted to be bradycardic, labs were unremarkable and her symptoms were concerning for vasovagal syncope.  She was last seen in clinic on 01/14/2023 for follow-up near syncope.  She had noted ongoing intermittent dizziness with position changes however denied ongoing presyncope or syncope.  Orthostatics were borderline positive in office.  Echocardiogram was ordered showing LVEF 60 to 65%, no RWMA, grade 2 DD, RV SF normal, biatrial size moderately dilated, trivial MR, aortic valve sclerosis/calcification.  Carotid ultrasounds were ordered showing normal normal right and left carotid arteries.  30-day event monitor showed sinus bradycardia, NSR, sinus tachycardia, rare PAC and PVC and no arrhythmias and no findings to explain syncope.       History of Present Illness:  Discussed the use of AI scribe software for clinical note transcription with the patient, who gave verbal consent to proceed.  [name] [name] is a 73 y.o. female who returns for syncope.    Patient comes into clinic today by herself.  She reports feeling 'rather good' and has been maintaining an active lifestyle by walking at the gym and the mall.  She has not had any further episodes of syncope or presyncope since the initial event.  She is without symptoms of exertional angina, SOB/DOE, leg swelling, palpitations, tachycardia, fatigue, weakness.  She denies any use of recreational drugs, smoking, or alcohol consumption. She also mentions plans to travel and maintain an active lifestyle.     Review of Systems  Constitutional: Negative for weight gain and weight loss.  Cardiovascular:  Negative for chest pain, claudication, dyspnea on exertion, irregular heartbeat, leg swelling, near-syncope, orthopnea, palpitations, paroxysmal nocturnal dyspnea and syncope.  Respiratory:  Negative for cough, hemoptysis and shortness of breath.   Gastrointestinal:  Negative for abdominal pain, hematochezia and melena.  Genitourinary:  Negative for hematuria.  Neurological:  Negative for dizziness and light-headedness.     See HPI     Home Medications:    Prior to Admission medications   Medication Sig Start Date End Date Taking? Authorizing Provider  acetaminophen  (TYLENOL ) 500 MG tablet Take 2 tablets (1,000 mg total) by mouth every 8 (eight) hours as needed for mild pain or moderate pain. 12/03/20   Ted Gerard CHRISTELLA, PA-C  aspirin  EC 81 MG tablet Take 1 tablet (81 mg total) by mouth 2 (two) times daily. For DVT prophylaxis for 30 days after surgery. Patient not taking: Reported on 01/14/2023 12/03/20   Gawne, Meghan M, PA-C  brimonidine (ALPHAGAN) 0.2 % ophthalmic solution Place 1 drop into both eyes 3 (three) times daily. 08/07/20   [provider]  fluticasone (FLONASE) 50 MCG/ACT nasal spray Place 1 spray into both nostrils daily. 10/10/20   [provider]  gabapentin (NEURONTIN) 800 MG tablet Take  800 mg by mouth 3 (three) times daily. 12/15/22   [provider]  GEMTESA 75 MG TABS Take 1 tablet by mouth daily. 09/26/22   [provider]  levocetirizine (XYZAL) 5 MG tablet Take 5 mg by mouth every evening.    [provider]  levothyroxine (SYNTHROID) 75 MCG tablet Take 75 mcg by mouth every morning. 09/28/19   [provider]  montelukast (SINGULAIR) 10 MG tablet Take 10 mg by mouth in the morning.    [provider]  Multiple Vitamin (MULTIVITAMIN WITH MINERALS) TABS tablet Take 1 tablet by mouth daily. Centrum Silver for Women    [provider]  rosuvastatin (CRESTOR) 10 MG tablet Take 10 mg by mouth daily. 08/04/22   [provider]  SIMBRINZA 1-0.2 % SUSP Apply 1 drop to eye 3 (three) times daily. 08/24/22   [provider]  spironolactone (ALDACTONE) 25 MG tablet Take 25 mg by mouth every morning. 08/09/20   [provider]  Travoprost, BAK Free, (TRAVATAMN) 0.004 % SOLN ophthalmic solution Place 1 drop into both eyes at bedtime.      [provider]   Studies Reviewed:   EKG Interpretation Date/Time:  Friday April 16 2023 13:19:06 EST Ventricular Rate:  50 PR Interval:  182 QRS Duration:  80 QT Interval:  466 QTC Calculation: 424 R Axis:   22  Text Interpretation: Sinus bradycardia Septal infarct , age undetermined Confirmed by Rana Dixon [phone]) on 04/16/2023 1:55:30 PM    Echocardiogram 02/25/2023 1. Left ventricular ejection fraction, by estimation, is 60 to 65%. The  left ventricle has normal function. The left ventricle has no regional  wall motion abnormalities. Left ventricular diastolic parameters are  consistent with Grade II diastolic  dysfunction (pseudonormalization).   2. Right ventricular systolic function is normal. The right ventricular  size is normal.   3. Left atrial size was moderately dilated.   4. Right atrial size was moderately dilated.   5. The mitral valve is normal in structure. No evidence of mitral valve  regurgitation. No evidence of mitral stenosis.   6. The aortic valve is tricuspid. There is mild calcification of the  aortic valve. Aortic valve regurgitation is trivial. Aortic valve   sclerosis/calcification is present, without any evidence of aortic  stenosis.   7. The inferior vena cava is normal in size with <50% respiratory  variability, suggesting right atrial pressure of 8 mmHg.   30-day heart monitor 02/11/2023 Sinus bradycardia, NSR, sinus tachycardia, rare PAC and PVC.   Carotid ultrasound 02/02/2023 Right Carotid: The extracranial vessels were near-normal with only minimal wall thickening or plaque.   Left Carotid: The extracranial vessels were near-normal with only minimal  wall thickening or plaque.   Vertebrals:  Bilateral vertebral arteries demonstrate antegrade flow.  Subclavians: Normal flow hemodynamics were seen in bilateral subclavian               arteries.  Risk Assessment/Calculations:    CHA2DS2-VASc Score = 5   This indicates a 7.2% annual risk of stroke. The patient's score is based upon: CHF History: 1 HTN History: 1 Diabetes History: 0 Stroke History: 0 Vascular Disease History: 1 Age Score: 1 Gender Score: 1            Physical Exam:   VS:  BP 126/74 (BP Location: Left Arm, Patient Position: Sitting, Cuff Size: Normal)   Pulse (!) 50   Ht 5' 2 (1.575 m)   Wt 154 lb 9.6 oz (  70.1 kg)   SpO2 96%   BMI 28.28 kg/m    Wt Readings from Last 3 Encounters:  04/16/23 154 lb 9.6 oz (70.1 kg)  01/14/23 152 lb 9.6 oz (69.2 kg)  12/18/22 149 lb 7.6 oz (67.8 kg)    Constitutional:      Appearance: Normal and healthy appearance. Not in distress.  HENT:     Head: Normocephalic.  Neck:     Vascular: JVD normal.  Pulmonary:     Effort: Pulmonary effort is normal.     Breath sounds: Normal breath sounds.  Chest:     Chest wall: Not tender to palpatation.  Cardiovascular:     PMI at left midclavicular line. Normal rate. Regular rhythm. Normal S1. Normal S2.      Murmurs: There is no murmur.     No gallop.  No click. No rub.  Pulses:    Intact distal pulses.  Edema:    Peripheral edema absent.  Musculoskeletal: Normal range  of motion.     Cervical back: Normal range of motion and neck supple. Skin:    General: Skin is warm and dry.  Neurological:     General: No focal deficit present.     Mental Status: Alert, oriented to person, place, and time and oriented to person, place and time.  Psychiatric:        Mood and Affect: Mood and affect normal.        Behavior: Behavior is cooperative.        Thought Content: Thought content normal.        Assessment and Plan:  Syncope  Presented to ED on 12/2022 following near syncopal episode, this occurred following a bowel movement, she was noted to be bradycardic during episode.  There was concern for vasovagal syncope. 30-day ZIO 02/2023 showed maximal heart rate of 115 bpm, minimum heart rate 34 bpm, average heart rate of 56 bpm.  No arrhythmia or pauses.  Echocardiogram 02/26/2023 with normal LVEF 60 to 65%, grade 2 DD, no valvular abnormalities Carotid ultrasound 01/2023 with near normal ICAs Since we last seen her she has been without presyncope/syncope and asymptomatic overall -Given reassuring imaging and studies likely vasovagal syncope given event occurred after BM -If further episodes of presyncope/syncope in the future, consider referral to EP for possible loop recorder and further evaluation of bradycardia -Evaluation of bradycardia as below   Paroxysmal atrial fibrillation  Occurred in the setting of hyperthyroidism.  There has not been documented recurrence and she is not currently on anticoagulation. She has been without any symptoms of recurrent arrhythmia Recent 30-day ZIO with no evidence of atrial fibrillation  Chronic diastolic heart failure Echo 02/2023 with LVEF 60 to 65%, grade 2 DD She is euvolemic and well compensated on exam.  NYHA class I. Does not currently require loop diuretic.  She is without angina, SOB, DOE, leg swelling -Encouraged fluid restriction low-salt dieting  History of bradycardia This is longstanding and persists.   However she is not symptomatic as she has not had increased fatigue. History of syncope as above, likely vasovagal. She denies any further syncope. Recent cardiac monitor as above.  -Appears asymptomatic  -Can refer to EP if syncope occurs again in the future -Avoid AV nodal blocking agents  Dyslipidemia LDL was 51 in 7/24 She will be having an updated lipid panel this month with PCP -Continue Rosuvastatin 10 mg daily  Hypothyroidism History of hyperthyroidism s/p thyroidectomy with subsequent hypothyroidism.  Dispo:  Return in about 6 months (around 10/14/2023).  Signed, Lum LITTIE Louis, NP

## 2023-04-16 NOTE — Patient Instructions (Signed)
 Medication Instructions:  Your physician recommends that you continue on your current medications as directed. Please refer to the Current Medication list given to you today.  *If you need a refill on your cardiac medications before your next appointment, please call your pharmacy*   Lab Work: NONE ordered at this time of appointment    Testing/Procedures: NONE ordered at this time of appointment     Follow-Up: At [facility], you and your health needs are our priority.  As part of our continuing mission to provide you with exceptional heart care, we have created designated Provider Care Teams.  These Care Teams include your primary Cardiologist (physician) and Advanced Practice Providers (APPs -  Physician Assistants and Nurse Practitioners) who all work together to provide you with the care you need, when you need it.  We recommend signing up for the patient portal called "MyChart".  Sign up information is provided on this After Visit Summary.  MyChart is used to connect with patients for Virtual Visits (Telemedicine).  Patients are able to view lab/test results, encounter notes, upcoming appointments, etc.  Non-urgent messages can be sent to your provider as well.   To learn more about what you can do with MyChart, go to [email or web address].    Your next appointment:   6 month(s)  Provider:   Olga Millers, MD

## 2023-05-05 ENCOUNTER — Other Ambulatory Visit: Payer: Self-pay | Admitting: Family Medicine

## 2023-05-05 DIAGNOSIS — Z1231 Encounter for screening mammogram for malignant neoplasm of breast: Secondary | ICD-10-CM

## 2023-05-05 DIAGNOSIS — Z79899 Other long term (current) drug therapy: Secondary | ICD-10-CM | POA: Diagnosis not present

## 2023-05-05 DIAGNOSIS — E039 Hypothyroidism, unspecified: Secondary | ICD-10-CM | POA: Diagnosis not present

## 2023-05-05 DIAGNOSIS — M792 Neuralgia and neuritis, unspecified: Secondary | ICD-10-CM | POA: Diagnosis not present

## 2023-05-05 DIAGNOSIS — R238 Other skin changes: Secondary | ICD-10-CM | POA: Diagnosis not present

## 2023-06-01 DIAGNOSIS — L309 Dermatitis, unspecified: Secondary | ICD-10-CM | POA: Diagnosis not present

## 2023-06-28 DIAGNOSIS — H401131 Primary open-angle glaucoma, bilateral, mild stage: Secondary | ICD-10-CM | POA: Diagnosis not present

## 2023-06-28 DIAGNOSIS — H524 Presbyopia: Secondary | ICD-10-CM | POA: Diagnosis not present

## 2023-06-28 DIAGNOSIS — H25813 Combined forms of age-related cataract, bilateral: Secondary | ICD-10-CM | POA: Diagnosis not present

## 2023-06-28 DIAGNOSIS — H35032 Hypertensive retinopathy, left eye: Secondary | ICD-10-CM | POA: Diagnosis not present

## 2023-07-22 ENCOUNTER — Other Ambulatory Visit (HOSPITAL_COMMUNITY): Payer: Self-pay

## 2023-07-22 ENCOUNTER — Telehealth: Payer: Self-pay

## 2023-07-22 NOTE — Telephone Encounter (Signed)
 Pharmacy Patient Advocate Encounter  Insurance verification completed.   The patient is insured through [employer] claim for [employer]. Currently a quantity of 30 is a 30 day supply and the co-pay is $0 .   This test claim was processed through [facility]- copay amounts may vary at other pharmacies due to pharmacy/plan contracts, or as the patient moves through the different stages of their insurance plan.

## 2023-09-27 DIAGNOSIS — L309 Dermatitis, unspecified: Secondary | ICD-10-CM | POA: Diagnosis not present

## 2023-10-12 DIAGNOSIS — L309 Dermatitis, unspecified: Secondary | ICD-10-CM | POA: Diagnosis not present

## 2023-10-13 ENCOUNTER — Ambulatory Visit
Admission: RE | Admit: 2023-10-13 | Discharge: 2023-10-13 | Disposition: A | Discharge status: Home / Self Care | Payer: 59 | Source: Ambulatory Visit | Attending: Family Medicine | Admitting: Family Medicine

## 2023-10-13 DIAGNOSIS — Z1231 Encounter for screening mammogram for malignant neoplasm of breast: Secondary | ICD-10-CM

## 2023-10-21 DIAGNOSIS — Z0000 Encounter for general adult medical examination without abnormal findings: Secondary | ICD-10-CM | POA: Diagnosis not present

## 2023-10-21 DIAGNOSIS — M1712 Unilateral primary osteoarthritis, left knee: Secondary | ICD-10-CM | POA: Diagnosis not present

## 2023-10-21 DIAGNOSIS — M792 Neuralgia and neuritis, unspecified: Secondary | ICD-10-CM | POA: Diagnosis not present

## 2023-10-21 DIAGNOSIS — I700 Atherosclerosis of aorta: Secondary | ICD-10-CM | POA: Diagnosis not present

## 2023-10-21 DIAGNOSIS — I10 Essential (primary) hypertension: Secondary | ICD-10-CM | POA: Diagnosis not present

## 2023-10-21 DIAGNOSIS — Z79899 Other long term (current) drug therapy: Secondary | ICD-10-CM | POA: Diagnosis not present

## 2023-10-21 DIAGNOSIS — E039 Hypothyroidism, unspecified: Secondary | ICD-10-CM | POA: Diagnosis not present

## 2023-10-22 ENCOUNTER — Other Ambulatory Visit: Payer: Self-pay | Admitting: Family Medicine

## 2023-10-22 DIAGNOSIS — Z1231 Encounter for screening mammogram for malignant neoplasm of breast: Secondary | ICD-10-CM

## 2023-10-25 DIAGNOSIS — M792 Neuralgia and neuritis, unspecified: Secondary | ICD-10-CM | POA: Diagnosis not present

## 2023-10-25 DIAGNOSIS — E039 Hypothyroidism, unspecified: Secondary | ICD-10-CM | POA: Diagnosis not present

## 2023-10-25 DIAGNOSIS — I10 Essential (primary) hypertension: Secondary | ICD-10-CM | POA: Diagnosis not present

## 2023-10-25 DIAGNOSIS — Z79899 Other long term (current) drug therapy: Secondary | ICD-10-CM | POA: Diagnosis not present

## 2023-10-28 DIAGNOSIS — E871 Hypo-osmolality and hyponatremia: Secondary | ICD-10-CM | POA: Diagnosis not present

## 2023-12-06 DIAGNOSIS — L309 Dermatitis, unspecified: Secondary | ICD-10-CM | POA: Diagnosis not present

## 2023-12-29 DIAGNOSIS — H401131 Primary open-angle glaucoma, bilateral, mild stage: Secondary | ICD-10-CM | POA: Diagnosis not present

## 2024-10-19 ENCOUNTER — Ambulatory Visit

## 2024-10-20 ENCOUNTER — Ambulatory Visit
